# Patient Record
Sex: Male | Born: 1955 | Race: White | Hispanic: No | Marital: Married | Smoking: Current every day smoker
Health system: Southern US, Community
[De-identification: ages and names within clinical notes are randomized; demographics above are authoritative.]

## PROBLEM LIST (undated history)

## (undated) DIAGNOSIS — I639 Cerebral infarction, unspecified: Secondary | ICD-10-CM

## (undated) HISTORY — PX: KNEE SURGERY: SHX244

---

## 2003-06-24 ENCOUNTER — Inpatient Hospital Stay (HOSPITAL_COMMUNITY): Admission: EM | Admit: 2003-06-24 | Discharge: 2003-06-25 | Payer: Self-pay | Admitting: Emergency Medicine

## 2014-09-10 ENCOUNTER — Other Ambulatory Visit: Payer: Self-pay

## 2015-11-29 ENCOUNTER — Emergency Department (HOSPITAL_COMMUNITY): Payer: Self-pay

## 2015-11-29 ENCOUNTER — Inpatient Hospital Stay (HOSPITAL_COMMUNITY)
Admission: EM | Admit: 2015-11-29 | Discharge: 2015-12-03 | DRG: 481 | Disposition: A | Discharge status: Home Health | Payer: Self-pay | Attending: Internal Medicine | Admitting: Internal Medicine

## 2015-11-29 ENCOUNTER — Encounter (HOSPITAL_COMMUNITY): Payer: Self-pay

## 2015-11-29 DIAGNOSIS — T148XXA Other injury of unspecified body region, initial encounter: Secondary | ICD-10-CM

## 2015-11-29 DIAGNOSIS — S42292A Other displaced fracture of upper end of left humerus, initial encounter for closed fracture: Secondary | ICD-10-CM | POA: Insufficient documentation

## 2015-11-29 DIAGNOSIS — M79604 Pain in right leg: Secondary | ICD-10-CM

## 2015-11-29 DIAGNOSIS — Z8673 Personal history of transient ischemic attack (TIA), and cerebral infarction without residual deficits: Secondary | ICD-10-CM

## 2015-11-29 DIAGNOSIS — R410 Disorientation, unspecified: Secondary | ICD-10-CM | POA: Diagnosis not present

## 2015-11-29 DIAGNOSIS — Z79899 Other long term (current) drug therapy: Secondary | ICD-10-CM

## 2015-11-29 DIAGNOSIS — E440 Moderate protein-calorie malnutrition: Secondary | ICD-10-CM | POA: Insufficient documentation

## 2015-11-29 DIAGNOSIS — E876 Hypokalemia: Secondary | ICD-10-CM | POA: Diagnosis present

## 2015-11-29 DIAGNOSIS — I639 Cerebral infarction, unspecified: Secondary | ICD-10-CM | POA: Diagnosis present

## 2015-11-29 DIAGNOSIS — F17210 Nicotine dependence, cigarettes, uncomplicated: Secondary | ICD-10-CM | POA: Diagnosis present

## 2015-11-29 DIAGNOSIS — Z7982 Long term (current) use of aspirin: Secondary | ICD-10-CM

## 2015-11-29 DIAGNOSIS — S72141A Displaced intertrochanteric fracture of right femur, initial encounter for closed fracture: Principal | ICD-10-CM | POA: Diagnosis present

## 2015-11-29 DIAGNOSIS — S72001A Fracture of unspecified part of neck of right femur, initial encounter for closed fracture: Secondary | ICD-10-CM

## 2015-11-29 DIAGNOSIS — W1830XA Fall on same level, unspecified, initial encounter: Secondary | ICD-10-CM | POA: Diagnosis present

## 2015-11-29 DIAGNOSIS — S42255A Nondisplaced fracture of greater tuberosity of left humerus, initial encounter for closed fracture: Secondary | ICD-10-CM | POA: Diagnosis present

## 2015-11-29 DIAGNOSIS — D500 Iron deficiency anemia secondary to blood loss (chronic): Secondary | ICD-10-CM | POA: Diagnosis present

## 2015-11-29 DIAGNOSIS — Z6822 Body mass index (BMI) 22.0-22.9, adult: Secondary | ICD-10-CM

## 2015-11-29 HISTORY — DX: Cerebral infarction, unspecified: I63.9

## 2015-11-29 LAB — COMPREHENSIVE METABOLIC PANEL
ALBUMIN: 3.8 g/dL (ref 3.5–5.0)
ALK PHOS: 60 U/L (ref 38–126)
ALT: 27 U/L (ref 17–63)
AST: 31 U/L (ref 15–41)
Anion gap: 12 (ref 5–15)
BUN: 14 mg/dL (ref 6–20)
CHLORIDE: 94 mmol/L — AB (ref 101–111)
CO2: 28 mmol/L (ref 22–32)
CREATININE: 0.67 mg/dL (ref 0.61–1.24)
Calcium: 8.6 mg/dL — ABNORMAL LOW (ref 8.9–10.3)
GFR calc non Af Amer: >60 mL/min (ref >60)
GLUCOSE: 112 mg/dL — AB (ref 65–99)
Potassium: 2.9 mmol/L — ABNORMAL LOW (ref 3.5–5.1)
SODIUM: 134 mmol/L — AB (ref 135–145)
Total Bilirubin: 2 mg/dL — ABNORMAL HIGH (ref 0.3–1.2)
Total Protein: 7.7 g/dL (ref 6.5–8.1)

## 2015-11-29 LAB — CBC WITH DIFFERENTIAL/PLATELET
BASOS ABS: 0 10*3/uL (ref 0.0–0.1)
BASOS PCT: 0 %
EOS ABS: 0 10*3/uL (ref 0.0–0.7)
EOS PCT: 0 %
HCT: 35.3 % — ABNORMAL LOW (ref 39.0–52.0)
HEMOGLOBIN: 12.5 g/dL — AB (ref 13.0–17.0)
Lymphocytes Relative: 8 %
Lymphs Abs: 0.8 10*3/uL (ref 0.7–4.0)
MCH: 42.1 pg — AB (ref 26.0–34.0)
MCHC: 35.4 g/dL (ref 30.0–36.0)
MCV: 118.9 fL — ABNORMAL HIGH (ref 78.0–100.0)
Monocytes Absolute: 1.4 10*3/uL — ABNORMAL HIGH (ref 0.1–1.0)
Monocytes Relative: 13 %
NEUTROS PCT: 79 %
Neutro Abs: 8.3 10*3/uL — ABNORMAL HIGH (ref 1.7–7.7)
PLATELETS: 257 10*3/uL (ref 150–400)
RBC: 2.97 MIL/uL — AB (ref 4.22–5.81)
RDW: 14.6 % (ref 11.5–15.5)
WBC: 10.5 10*3/uL (ref 4.0–10.5)

## 2015-11-29 LAB — TYPE AND SCREEN
ABO/RH(D): O NEG
ANTIBODY SCREEN: NEGATIVE

## 2015-11-29 LAB — PROTIME-INR
INR: 0.93
Prothrombin Time: 12.4 seconds (ref 11.4–15.2)

## 2015-11-29 MED ORDER — POTASSIUM CHLORIDE CRYS ER 20 MEQ PO TBCR
40.0000 meq | EXTENDED_RELEASE_TABLET | Freq: Once | ORAL | Status: AC
  Administered 2015-11-29: 40 meq via ORAL
  Filled 2015-11-29: qty 2

## 2015-11-29 MED ORDER — ASPIRIN 325 MG PO TABS
325.0000 mg | ORAL_TABLET | Freq: Every day | ORAL | Status: DC
  Administered 2015-11-29 – 2015-12-03 (×5): 325 mg via ORAL
  Filled 2015-11-29 (×5): qty 1

## 2015-11-29 MED ORDER — SODIUM CHLORIDE 0.9 % IV SOLN: INTRAVENOUS | Status: DC

## 2015-11-29 MED ORDER — HYDROCODONE-ACETAMINOPHEN 5-325 MG PO TABS
1.0000 | ORAL_TABLET | Freq: Four times a day (QID) | ORAL | Status: DC | PRN
  Administered 2015-11-29 – 2015-12-01 (×5): 2 via ORAL
  Filled 2015-11-29 (×5): qty 2

## 2015-11-29 MED ORDER — POTASSIUM CHLORIDE IN NACL 20-0.9 MEQ/L-% IV SOLN
INTRAVENOUS | Status: DC
  Administered 2015-11-29: 17:00:00 via INTRAVENOUS

## 2015-11-29 MED ORDER — MORPHINE SULFATE (PF) 4 MG/ML IV SOLN
6.0000 mg | Freq: Once | INTRAVENOUS | Status: AC
  Administered 2015-11-29: 6 mg via INTRAVENOUS
  Filled 2015-11-29: qty 2

## 2015-11-29 MED ORDER — MORPHINE SULFATE (PF) 2 MG/ML IV SOLN
0.5000 mg | INTRAVENOUS | Status: DC | PRN
  Administered 2015-11-30 (×2): 0.5 mg via INTRAVENOUS
  Filled 2015-11-29 (×2): qty 1

## 2015-11-29 MED ORDER — HEPARIN SODIUM (PORCINE) 5000 UNIT/ML IJ SOLN
5000.0000 [IU] | Freq: Three times a day (TID) | INTRAMUSCULAR | Status: DC
  Administered 2015-11-29 – 2015-11-30 (×5): 5000 [IU] via SUBCUTANEOUS
  Filled 2015-11-29 (×6): qty 1

## 2015-11-29 MED ORDER — MORPHINE SULFATE (PF) 4 MG/ML IV SOLN
4.0000 mg | INTRAVENOUS | Status: DC | PRN
  Administered 2015-11-29: 4 mg via INTRAVENOUS
  Filled 2015-11-29: qty 1

## 2015-11-29 NOTE — ED Provider Notes (Signed)
[facility]-EMERGENCY DEPT Provider Note   CSN: [id number] Arrival date & time: 11/29/15  1050  By signing my name below, I, Placido Sou, attest that this documentation has been prepared under the direction and in the presence of Azalia Bilis, MD. Electronically Signed: Placido Sou, ED Scribe. 11/29/15. 11:49 AM.   History   Chief Complaint Chief Complaint  Patient presents with  . Fall    HPI HPI Comments: Chris Robinson is a 60 y.o. male who presents to the Emergency Department by ambulance complaining of a fall that occurred two days ago. He states his wife fell towards him causing him to fall to the ground and land on his right side. He reports associated, moderate, right hip and right anterior thigh pain. He denies having ambulated since the fall and reports worsening pain with any movement at all. He also complains of left shoulder pain for the past few months s/p another fall but denies it has acutely worsened since his fall two days ago. Pt denies a h/o hip fracture. He reports a h/o of stroke and experiences generalized weakness with right greater than left. He is a smoker. Pt does not regularly consume ETOH. He denies abdominal pain and CP.   The history is provided by the patient. No language interpreter was used.   Past Medical History:  Diagnosis Date  . Stroke [facility])     There are no active problems to display for this patient.   Past Surgical History:  Procedure Laterality Date  . KNEE SURGERY       Home Medications    Prior to Admission medications   Medication Sig Start Date End Date Taking? Authorizing Provider  acetaminophen (TYLENOL) 500 MG tablet Take 1,000 mg by mouth every 6 (six) hours as needed.   Yes Historical Provider, MD  aspirin 325 MG tablet Take 325 mg by mouth daily.   Yes Historical Provider, MD    Family History No family history on file.  Social History Social History  Substance Use Topics  . Smoking status: Current Every Day Smoker   . Smokeless tobacco: Never Used  . Alcohol use Yes     Comment: occ     Allergies   Review of patient's allergies indicates no known allergies.   Review of Systems Review of Systems  Cardiovascular: Negative for chest pain.  Gastrointestinal: Negative for abdominal pain.  Musculoskeletal: Positive for arthralgias and myalgias.  Skin: Negative for wound.  Neurological: Positive for weakness.  All other systems reviewed and are negative.  Physical Exam Updated Vital Signs BP 149/99 (BP Location: Left Arm)   Pulse 107   Temp 99.1 F (37.3 C) (Oral)   Resp 16   Ht 6' (1.829 m)   Wt 175 lb (79.4 kg)   SpO2 97%   BMI 23.73 kg/m   Physical Exam  Constitutional: He is oriented to person, place, and time. He appears well-developed and well-nourished.  HENT:  Head: Normocephalic and atraumatic.  Eyes: EOM are normal.  Neck: Normal range of motion.  Cardiovascular: Normal rate, regular rhythm, normal heart sounds and intact distal pulses.   Pulmonary/Chest: Effort normal and breath sounds normal. No respiratory distress.  Abdominal: Soft. He exhibits no distension. There is no tenderness.  Musculoskeletal: Normal range of motion. He exhibits tenderness.  Shortening and external rotation of RLE. Nml DP/PT pulse. Pain with ROM of the left shoulder w/o obvious deformity.   Neurological: He is alert and oriented to person, place, and time.  Skin:  Skin is warm and dry.  Psychiatric: He has a normal mood and affect. Judgment normal.  Nursing note and vitals reviewed.  ED Treatments / Results  Labs (all labs ordered are listed, but only abnormal results are displayed) Labs Reviewed  CBC WITH DIFFERENTIAL/PLATELET - Abnormal; Notable for the following:       Result Value   RBC 2.97 (*)    Hemoglobin 12.5 (*)    HCT 35.3 (*)    MCV 118.9 (*)    MCH 42.1 (*)    Neutro Abs 8.3 (*)    Monocytes Absolute 1.4 (*)    All other components within normal limits  PROTIME-INR    COMPREHENSIVE METABOLIC PANEL  TYPE AND SCREEN    EKG  EKG Interpretation  Date/Time:  Tuesday November 29 2015 12:08:13 EDT Ventricular Rate:  98 PR Interval:    QRS Duration: 101 QT Interval:  359 QTC Calculation: 459 R Axis:   25 Text Interpretation:  Sinus tachycardia Atrial premature complexes Borderline short PR interval Low voltage, extremity leads Borderline ST depression, diffuse leads No significant change was found Confirmed by Verline Kong  MD, Caryn Bee ([id number]) on 11/29/2015 12:48:48 PM       Radiology Dg Chest 1 View  Result Date: 11/29/2015 CLINICAL DATA:  60 year old male status post falls. Hip fracture. Preoperative study. Initial encounter. EXAM: CHEST 1 VIEW COMPARISON:  Chest radiographs 06/24/2003 FINDINGS: AP supine view at 1216 hours.Cardiac size at the upper limits of normal to mildly enlarged. Other mediastinal contours are within normal limits. Visualized tracheal air column is within normal limits. Allowing for technique the lungs are clear. No pneumothorax or effusion. IMPRESSION: No acute cardiopulmonary abnormality. Borderline to mild cardiomegaly. Electronically Signed   By: Odessa Fleming M.D.   On: 11/29/2015 12:50   Dg Shoulder Left  Result Date: 11/29/2015 CLINICAL DATA:  Acute left shoulder pain following fall. Initial encounter. EXAM: LEFT SHOULDER - 2+ VIEW COMPARISON:  None. FINDINGS: A mildly comminuted fracture of the lateral posterior humeral head/ greater tuberosity identified. There is no evidence of subluxation or dislocation. No suspicious focal bony lesions are noted. No other fractures identified. IMPRESSION: Mildly comminuted lateral posterior humeral head/ greater tuberosity fracture. Electronically Signed   By: Harmon Pier M.D.   On: 11/29/2015 12:52   Dg Hip Unilat W Or Wo Pelvis 2-3 Views Right  Result Date: 11/29/2015 CLINICAL DATA:  Acute right hip pain following fall 2 days ago. Initial encounter. EXAM: DG HIP (WITH OR WITHOUT PELVIS) 2-3V  RIGHT COMPARISON:  None. FINDINGS: A mildly comminuted intertrochanteric fracture the proximal right femur noted without significant angulation identified. There is no evidence of subluxation or dislocation. Mild degenerative changes in the hips are present bilaterally. No suspicious focal bony lesions are noted. IMPRESSION: Mildly comminuted right intertrochanteric femur fracture. Electronically Signed   By: Harmon Pier M.D.   On: 11/29/2015 12:50    Procedures Procedures  DIAGNOSTIC STUDIES: Oxygen Saturation is 99% on RA, normal by my interpretation.    COORDINATION OF CARE: 11:47 AM Discussed next steps with pt. Pt verbalized understanding and is agreeable with the plan.    Medications Ordered in ED Medications  morphine 4 MG/ML injection 6 mg (6 mg Intravenous Given 11/29/15 1159)     Initial Impression / Assessment and Plan / ED Course  I have reviewed the triage vital signs and the nursing notes.  Pertinent labs & imaging results that were available during my care of the patient were reviewed by me and  considered in my medical decision making (see chart for details).  Clinical Course    Right intertrochanteric hip fracture.  Left shoulder fracture of the humeral head.  Voice message left for Dr. Romeo AppleHarrison, orthopedic surgery.  Patient be admitted to the hospital.  Nothing by mouth now.   I personally performed the services described in this documentation, which was scribed in my presence. The recorded information has been reviewed and is accurate.      Final Clinical Impressions(s) / ED Diagnoses   Final diagnoses:  Closed displaced intertrochanteric fracture of right femur, initial encounter (HCC)  Humeral head fracture, left, closed, initial encounter    New Prescriptions New Prescriptions   No medications on file     Azalia BilisKevin Pragya Lofaso, MD 11/29/15 1318

## 2015-11-29 NOTE — H&P (Signed)
History and Physical    Chris Robinson BJY:[id number] DOB: 18-Dec-1955 DOA: 11/29/2015  PCP: No PCP Per Patient  Patient coming from: Home  Chief Complaint:  Right hip pain  HPI: Chris Robinson is a 60 y.o. male with medical history significant of CVA and cigarettes smoking. Patient came to the hospital because of right hip pain. Patient reported that he was trying to help his wife who fell on Sunday and because of his balance issues secondary to his previous stroke he fell himself and landed on his right hip he developed severe pain but he was able to crawl back to his bed. He decided to come today to seek medical help, because he couldn't walk on it. X-ray showed mildly comminuted intertrochanteric right hip fracture.  ED Course:  Vitals: WNL Labs: WNL except for hypokalemia Imaging: Normal CXR Interventions: Morphine for pain, orthopedics consulted.  Review of Systems:  Constitutional: negative for anorexia, fevers and sweats. Eyes: negative for irritation, redness and visual disturbance Ears, nose, mouth, throat, and face: negative for earaches, epistaxis, nasal congestion and sore throat Respiratory: negative for cough, dyspnea on exertion, sputum and wheezing Cardiovascular: negative for chest pain, dyspnea, lower extremity edema, orthopnea, palpitations and syncope Gastrointestinal: negative for abdominal pain, constipation, diarrhea, melena, nausea and vomiting Genitourinary:negative for dysuria, frequency and hematuria Hematologic/lymphatic: negative for bleeding, easy bruising and lymphadenopathy Musculoskeletal: Right hip pain Neurological: negative for coordination problems, gait problems, headaches and weakness Endocrine: negative for diabetic symptoms including polydipsia, polyuria and weight loss Allergic/Immunologic: negative for anaphylaxis, hay fever and urticaria  Past Medical History:  Diagnosis Date  . Stroke [facility])     Past Surgical History:  Procedure  Laterality Date  . KNEE SURGERY       reports that he has been smoking.  He has never used smokeless tobacco. He reports that he drinks alcohol. He reports that he does not use drugs.  No Known Allergies  Family history -Wife has history of brain cancer  Prior to Admission medications   Medication Sig Start Date End Date Taking? Authorizing Provider  acetaminophen (TYLENOL) 500 MG tablet Take 1,000 mg by mouth every 6 (six) hours as needed.   Yes Historical Provider, MD  aspirin 325 MG tablet Take 325 mg by mouth daily.   Yes Historical Provider, MD    Physical Exam:  Vitals:   11/29/15 1056 11/29/15 1134 11/29/15 1253 11/29/15 1354  BP: 146/96 132/87 149/99 143/85  Pulse: 99 100 107 99  Resp: 20 18 16 16   Temp: 99.1 F (37.3 C)     TempSrc: Oral     SpO2: 99% 98% 97% 96%  Weight:      Height:        Constitutional: NAD, calm, comfortable Eyes: PERRL, lids and conjunctivae normal ENMT: Mucous membranes are moist. Posterior pharynx clear of any exudate or lesions.Normal dentition.  Neck: normal, supple, no masses, no thyromegaly Respiratory: clear to auscultation bilaterally, no wheezing, no crackles. Normal respiratory effort. No accessory muscle use.  Cardiovascular: Regular rate and rhythm, no murmurs / rubs / gallops. No extremity edema. 2+ pedal pulses. No carotid bruits.  Abdomen: no tenderness, no masses palpated. No hepatosplenomegaly. Bowel sounds positive.  Musculoskeletal: no clubbing / cyanosis. No joint deformity upper and lower extremities. Good ROM, no contractures. Normal muscle tone.  Skin: no rashes, lesions, ulcers. No induration Neurologic: CN 2-12 grossly intact. Sensation intact, DTR normal. Strength 5/5 in all 4.  Psychiatric: Normal judgment and insight. Alert and oriented  x 3. Normal mood.   Labs on Admission: I have personally reviewed following labs and imaging studies  CBC:  Recent Labs Lab 11/29/15 1152  WBC 10.5  NEUTROABS 8.3*  HGB  12.5*  HCT 35.3*  MCV 118.9*  PLT 257   Basic Metabolic Panel:  Recent Labs Lab 11/29/15 1152  NA 134*  K 2.9*  CL 94*  CO2 28  GLUCOSE 112*  BUN 14  CREATININE 0.67  CALCIUM 8.6*   GFR: Estimated Creatinine Clearance: 107.8 mL/min (by C-G formula based on SCr of 0.67 mg/dL). Liver Function Tests:  Recent Labs Lab 11/29/15 1152  AST 31  ALT 27  ALKPHOS 60  BILITOT 2.0*  PROT 7.7  ALBUMIN 3.8   No results for input(s): LIPASE, AMYLASE in the last 168 hours. No results for input(s): AMMONIA in the last 168 hours. Coagulation Profile:  Recent Labs Lab 11/29/15 1152  INR 0.93   Cardiac Enzymes: No results for input(s): CKTOTAL, CKMB, CKMBINDEX, TROPONINI in the last 168 hours. BNP (last 3 results) No results for input(s): PROBNP in the last 8760 hours. HbA1C: No results for input(s): HGBA1C in the last 72 hours. CBG: No results for input(s): GLUCAP in the last 168 hours. Lipid Profile: No results for input(s): CHOL, HDL, LDLCALC, TRIG, CHOLHDL, LDLDIRECT in the last 72 hours. Thyroid Function Tests: No results for input(s): TSH, T4TOTAL, FREET4, T3FREE, THYROIDAB in the last 72 hours. Anemia Panel: No results for input(s): VITAMINB12, FOLATE, FERRITIN, TIBC, IRON, RETICCTPCT in the last 72 hours. Urine analysis: No results found for: COLORURINE, APPEARANCEUR, LABSPEC, PHURINE, GLUCOSEU, HGBUR, BILIRUBINUR, KETONESUR, PROTEINUR, UROBILINOGEN, NITRITE, LEUKOCYTESUR Sepsis Labs: !!!!!!!!!!!!!!!!!!!!!!!!!!!!!!!!!!!!!!!!!!!! Invalid input(s): PROCALCITONIN, LACTICIDVEN No results found for this or any previous visit (from the past 240 hour(s)).   Radiological Exams on Admission: Dg Chest 1 View  Result Date: 11/29/2015 CLINICAL DATA:  60 year old male status post falls. Hip fracture. Preoperative study. Initial encounter. EXAM: CHEST 1 VIEW COMPARISON:  Chest radiographs 06/24/2003 FINDINGS: AP supine view at 1216 hours.Cardiac size at the upper limits of  normal to mildly enlarged. Other mediastinal contours are within normal limits. Visualized tracheal air column is within normal limits. Allowing for technique the lungs are clear. No pneumothorax or effusion. IMPRESSION: No acute cardiopulmonary abnormality. Borderline to mild cardiomegaly. Electronically Signed   By: Odessa FlemingH  Hall M.D.   On: 11/29/2015 12:50   Dg Shoulder Left  Result Date: 11/29/2015 CLINICAL DATA:  Acute left shoulder pain following fall. Initial encounter. EXAM: LEFT SHOULDER - 2+ VIEW COMPARISON:  None. FINDINGS: A mildly comminuted fracture of the lateral posterior humeral head/ greater tuberosity identified. There is no evidence of subluxation or dislocation. No suspicious focal bony lesions are noted. No other fractures identified. IMPRESSION: Mildly comminuted lateral posterior humeral head/ greater tuberosity fracture. Electronically Signed   By: Harmon PierJeffrey  Hu M.D.   On: 11/29/2015 12:52   Dg Hip Unilat W Or Wo Pelvis 2-3 Views Right  Result Date: 11/29/2015 CLINICAL DATA:  Acute right hip pain following fall 2 days ago. Initial encounter. EXAM: DG HIP (WITH OR WITHOUT PELVIS) 2-3V RIGHT COMPARISON:  None. FINDINGS: A mildly comminuted intertrochanteric fracture the proximal right femur noted without significant angulation identified. There is no evidence of subluxation or dislocation. Mild degenerative changes in the hips are present bilaterally. No suspicious focal bony lesions are noted. IMPRESSION: Mildly comminuted right intertrochanteric femur fracture. Electronically Signed   By: Harmon PierJeffrey  Hu M.D.   On: 11/29/2015 12:50    EKG: Independently reviewed.  Assessment/Plan Principal Problem:   Closed right hip fracture, initial encounter (HCC) Active Problems:   Hypokalemia   CVA (cerebral vascular accident) (HCC)    Right hip fracture -Mildly comminuted right intertrochanteric hip fracture presented after a fall. -Orthopedics, Dr. Romeo Apple consulted. -DVT prophylaxis:  Subcutaneous heparin -Pain control : Opioids -Cardiac risk stratification: Patient is low risk for surgery.  Hypokalemia -Potassium of 2.9, will replete with oral and IV supplements, check BMP in a.m.  History of CVA -Patient is on aspirin, continue.  Fall -Mechanical fall, no further workup.   DVT prophylaxis: SQ Heparin Code Status:  Full code Family Communication: Plan D/W patient Disposition Plan: Home Consults called:  Admission status: Inpatient   [facility] A MD [facility] Pager [phone]  If 7PM-7AM, please contact night-coverage [email or web address] Password [email or web address]  11/29/2015, 2:00 PM

## 2015-11-29 NOTE — ED Triage Notes (Signed)
Ems reports pt fell Sunday and c/o pain in r hip and left shoulder.  Reports has been nonambulatory since the fall.  Reports rotation noted to r leg.

## 2015-11-29 NOTE — ED Notes (Signed)
Pt offered sling, states he is comfortable as is and does not want at this time.

## 2015-11-30 ENCOUNTER — Inpatient Hospital Stay (HOSPITAL_COMMUNITY): Payer: Self-pay

## 2015-11-30 DIAGNOSIS — I63019 Cerebral infarction due to thrombosis of unspecified vertebral artery: Secondary | ICD-10-CM

## 2015-11-30 DIAGNOSIS — S42292A Other displaced fracture of upper end of left humerus, initial encounter for closed fracture: Secondary | ICD-10-CM

## 2015-11-30 DIAGNOSIS — S72141A Displaced intertrochanteric fracture of right femur, initial encounter for closed fracture: Principal | ICD-10-CM

## 2015-11-30 DIAGNOSIS — I509 Heart failure, unspecified: Secondary | ICD-10-CM

## 2015-11-30 LAB — ECHOCARDIOGRAM COMPLETE
CHL CUP MV DEC (S): 257
CHL CUP RV SYS PRESS: 43 mmHg
CHL CUP STROKE VOLUME: 38 mL
E/e' ratio: 8.03
EWDT: 257 ms
FS: 35 % (ref 28–44)
Height: 72 in
IV/PV OW: 1
LA diam end sys: 35 mm
LA vol: 54.5 mL
LADIAMINDEX: 1.78 cm/m2
LASIZE: 35 mm
LAVOLA4C: 48.4 mL
LAVOLIN: 27.8 mL/m2
LV E/e' medial: 8.03
LV PW d: 10.2 mm — AB (ref 0.6–1.1)
LV SIMPSON'S DISK: 65
LV TDI E'LATERAL: 8.54
LV TDI E'MEDIAL: 8.16
LV dias vol index: 30 mL/m2
LV dias vol: 58 mL — AB (ref 62–150)
LV e' LATERAL: 8.54 cm/s
LV sys vol: 21 mL (ref 21–61)
LVEEAVG: 8.03
LVOT VTI: 15.3 cm
LVOT area: 3.46 cm2
LVOT diameter: 21 mm
LVOT peak grad rest: 3 mmHg
LVOTPV: 84.1 cm/s
LVOTSV: 53 mL
LVSYSVOLIN: 10 mL/m2
Lateral S' vel: 15.1 cm/s
MVPKAVEL: 60.9 m/s
MVPKEVEL: 68.6 m/s
Reg peak vel: 315 cm/s
TAPSE: 22.3 mm
TRMAXVEL: 315 cm/s
WEIGHTICAEL: 2677.27 [oz_av]

## 2015-11-30 LAB — BASIC METABOLIC PANEL
ANION GAP: 9 (ref 5–15)
BUN: 14 mg/dL (ref 6–20)
CALCIUM: 8 mg/dL — AB (ref 8.9–10.3)
CO2: 28 mmol/L (ref 22–32)
Chloride: 97 mmol/L — ABNORMAL LOW (ref 101–111)
Creatinine, Ser: 0.59 mg/dL — ABNORMAL LOW (ref 0.61–1.24)
GLUCOSE: 115 mg/dL — AB (ref 65–99)
POTASSIUM: 3.2 mmol/L — AB (ref 3.5–5.1)
SODIUM: 134 mmol/L — AB (ref 135–145)

## 2015-11-30 LAB — CBC
HCT: 32.1 % — ABNORMAL LOW (ref 39.0–52.0)
Hemoglobin: 11.1 g/dL — ABNORMAL LOW (ref 13.0–17.0)
MCH: 41.4 pg — ABNORMAL HIGH (ref 26.0–34.0)
MCHC: 34.6 g/dL (ref 30.0–36.0)
MCV: 119.8 fL — AB (ref 78.0–100.0)
PLATELETS: 233 10*3/uL (ref 150–400)
RBC: 2.68 MIL/uL — AB (ref 4.22–5.81)
RDW: 13.1 % (ref 11.5–15.5)
WBC: 8.3 10*3/uL (ref 4.0–10.5)

## 2015-11-30 LAB — SURGICAL PCR SCREEN
MRSA, PCR: NEGATIVE
Staphylococcus aureus: NEGATIVE

## 2015-11-30 LAB — MAGNESIUM: MAGNESIUM: 1.4 mg/dL — AB (ref 1.7–2.4)

## 2015-11-30 MED ORDER — SODIUM CHLORIDE 0.9 % IV SOLN
INTRAVENOUS | Status: AC
  Administered 2015-11-30: 22:00:00 via INTRAVENOUS

## 2015-11-30 MED ORDER — POTASSIUM CHLORIDE CRYS ER 20 MEQ PO TBCR
40.0000 meq | EXTENDED_RELEASE_TABLET | Freq: Two times a day (BID) | ORAL | Status: DC
  Administered 2015-11-30 (×2): 40 meq via ORAL
  Filled 2015-11-30 (×2): qty 2

## 2015-11-30 MED ORDER — ENSURE ENLIVE PO LIQD
237.0000 mL | ORAL | Status: DC
  Administered 2015-12-01 – 2015-12-02 (×2): 237 mL via ORAL

## 2015-11-30 MED ORDER — MAGNESIUM SULFATE 2 GM/50ML IV SOLN
2.0000 g | Freq: Once | INTRAVENOUS | Status: AC
  Administered 2015-11-30: 2 g via INTRAVENOUS
  Filled 2015-11-30: qty 50

## 2015-11-30 NOTE — Care Management Note (Signed)
Case Management Note  Patient Details  Name: Tyler DeisCraig S Ridling MRN: [id number] Date of Birth: [birth year]  Subjective/Objective:                  Pt is from home, lives with his wife. 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 He has no insurance. He is ind with ADL's at baseline. Repair of his femur rx is scheduled for tomorrow. He plans to return home at DC. He anticipates having some visits with HH. Pt states he has cane, walker, shower chair at home to use if needed. He does not have WC.   Action/Plan: Will cont to follow and determine pt needs at DC.   Expected Discharge Date:    12/03/2015              Expected Discharge Plan:  Home w Home Health Services  In-House Referral:  NA  Discharge planning Services  CM Consult  Post Acute Care Choice:  Home Health Choice offered to:  Patient  Status of Service:  In process, will continue to follow  Malcolm MetroChildress, Jailine Lieder Demske, RN 11/30/2015, 2:15 PM

## 2015-11-30 NOTE — Progress Notes (Signed)
Initial Nutrition Assessment  DOCUMENTATION CODES:   Non-severe (moderate) malnutrition in context of acute illness/injury  INTERVENTION:   - Provide Ensure Enlive oral nutrition supplement Q24. Will increase frequency after surgery if pt agreeable. Each provides 350 kcal and 20 grams protein. - Encourage PO intake. - Will continue to monitor for nutrition-related needs.  NUTRITION DIAGNOSIS:   Malnutrition related to acute illness as evidenced by mild depletion of body fat, moderate depletions of muscle mass.  GOAL:   Patient will meet greater than or equal to 90% of their needs  MONITOR:   PO intake, Supplement acceptance  REASON FOR ASSESSMENT:   Consult Hip fracture protocol  ASSESSMENT:   60 y.o. male with medical history significant of CVA and cigarettes smoking. Patient came to the hospital because of right hip pain. Patient reported that he was trying to help his wife who fell on Sunday and because of his balance issues secondary to his previous stroke he fell himself and landed on his right hip he developed severe pain but he was able to crawl back to his bed. He decided to come today to seek medical help, because he couldn't walk on it. X-ray showed mildly comminuted intertrochanteric right hip fracture.  Noted surgery scheduled for 12/01/15.  Spoke with pt at bedside who reports poor appetite 2-3 days PTA. Prior to episode of poor appetite, pt reports fairly good appetite and eating 2 meals per day. For lunch, pt reports typically consume homemade soup or a sandwich. Pt reports a typical dinner consists of 3 items. Per chart, pt consumed 50% of past two meals.  Pt reports UBW is 175#. Pt unsure when he last weighed this but says his weight is "stable." Wt hx unavailable in chart.  Pt interested in receiving Ensure Enlive oral nutrition supplement. Will order Q24 and increase after surgery if pt agreeable.  Pt denies any issues chewing or swallowing. States he does  most of the grocery shopping and cooking at home. Pt reports consuming a MVI PTA.  Medications reviewed and include 5,000 units IV heparin every 8 hrs, 2 g IV magnesium sulfate once, 40 mEq potassium chloride BID  Labs reviewed and include low sodium (134 mmol/L), low potassium (3.2 mmol/L), low chloride (97 mmol/L), low creatinine (0.59 mg/dL), low calcium (8.0 mg/dL), elevated glucose (440115 mg/dL), low magnesium (1.4 mg/dL)  NFPE: Exam completed. Mild fat depletion, mild/moderate muscle depletion, and no edema noted.  Diet Order:  Diet NPO time specified Except for: Sips with Meds Diet Heart Room service appropriate? Yes; Fluid consistency: Thin  Skin:  Reviewed, no issues  Last BM:  11/26/15  Height:   Ht Readings from Last 1 Encounters:  11/29/15 6' (1.829 m)    Weight:   Wt Readings from Last 1 Encounters:  11/29/15 167 lb 5.3 oz (75.9 kg)    Ideal Body Weight:  80.9 kg  BMI:  Body mass index is 22.69 kg/m.  Estimated Nutritional Needs:   Kcal:  1900-2100 (25-28 kcal/kg)  Protein:  91-106 grams (1.2-1.4 g/kg)  Fluid:  1.9-2.0 L/day  EDUCATION NEEDS:   No education needs identified at this time  Rosemarie AxKate Cayman [place] Dietetic Intern Pager Number: [phone]

## 2015-11-30 NOTE — Consult Note (Signed)
Reason for Consult: right hip fracture  Referring Physician: DR Burney Gauze  Chris Robinson is an 60 y.o. male.  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  C/o severe right hip pain non radiating constant worse with movement   The patient fell 2 weeks ago injured his left shoulder did not seek medical attention. During a workup of his hip fracture he was found to have a nondisplaced greater tuberosity fracture the left shoulder. He complains of constant pain there as well although has regained almost full range of motion.  Past Medical History:  Diagnosis Date  . Stroke [facility])     Past Surgical History:  Procedure Laterality Date  . KNEE SURGERY      Family history denies anesthetic problems, diabetes hypertension.  Social History:  reports that he has been smoking.  He has never used smokeless tobacco. He reports that he drinks alcohol. He reports that he does not use drugs.  Allergies: No Known Allergies   Current Facility-Administered Medications:  .  0.9 % NaCl with KCl 20 mEq/ L  infusion, , Intravenous, Continuous, Clydia Llano, MD, Last Rate: 75 mL/hr at 11/29/15 1717 .  aspirin tablet 325 mg, 325 mg, Oral, Daily, Clydia Llano, MD, 325 mg at 11/29/15 1707 .  heparin injection 5,000 Units, 5,000 Units, Subcutaneous, Q8H, Clydia Llano, MD, 5,000 Units at 11/30/15 0518 .  HYDROcodone-acetaminophen (NORCO/VICODIN) 5-325 MG per tablet 1-2 tablet, 1-2 tablet, Oral, Q6H PRN, Clydia Llano, MD, 2 tablet at 11/30/15 0518 .  morphine 2 MG/ML injection 0.5 mg, 0.5 mg, Intravenous, Q2H PRN, Clydia Llano, MD .  potassium chloride SA (K-DUR,KLOR-CON) CR tablet 40 mEq, 40 mEq, Oral, BID, Vickki Hearing, MD   Dg Chest 1 View  Result Date: 11/29/2015 CLINICAL DATA:  60 year old male status post falls. Hip fracture. Preoperative study. Initial encounter. EXAM: CHEST 1 VIEW COMPARISON:  Chest radiographs 06/24/2003 FINDINGS: AP supine view at 1216 hours.Cardiac size at the upper limits of normal to mildly enlarged. Other mediastinal contours are within normal limits. Visualized tracheal air column is within normal limits. Allowing for technique the lungs are clear. No pneumothorax or effusion. IMPRESSION: No acute cardiopulmonary abnormality. Borderline to mild cardiomegaly. Electronically Signed   By: Odessa Fleming M.D.   On: 11/29/2015 12:50   Dg Shoulder Left  Result Date: 11/29/2015 CLINICAL DATA:  Acute left shoulder pain following fall. Initial encounter. EXAM: LEFT SHOULDER - 2+ VIEW COMPARISON:  None. FINDINGS: A mildly comminuted fracture of the lateral posterior humeral head/ greater tuberosity identified. There is no evidence of subluxation or dislocation. No suspicious focal bony lesions are noted. No other fractures identified. IMPRESSION: Mildly comminuted lateral posterior humeral head/ greater tuberosity fracture. Electronically Signed   By: Harmon Pier M.D.   On: 11/29/2015 12:52   Dg Hip Unilat W Or Wo Pelvis 2-3 Views Right  Result Date: 11/29/2015 CLINICAL DATA:  Acute right hip pain following fall 2 days ago. Initial encounter. EXAM: DG HIP (WITH OR WITHOUT PELVIS) 2-3V RIGHT COMPARISON:  None. FINDINGS: A mildly comminuted intertrochanteric fracture the proximal right femur noted without significant angulation identified. There is no evidence of subluxation or dislocation. Mild degenerative changes in the hips are present  bilaterally. No suspicious focal bony lesions are noted. IMPRESSION: Mildly comminuted right intertrochanteric femur fracture. Electronically Signed   By: Harmon Pier M.D.   On: 11/29/2015 12:50    ROS Blood pressure 132/67, pulse 97, temperature 98 F  (36.7 C), temperature source Oral, resp. rate 18, height 6' (1.829 m), weight 167 lb 5.3 oz (75.9 kg), SpO2 95 %. Physical Exam  Constitutional: He is oriented to person, place, and time. He appears well-developed and well-nourished. No distress.  HENT:  Head: Normocephalic and atraumatic.  Right Ear: External ear normal.  Left Ear: External ear normal.  Eyes: Conjunctivae and EOM are normal. Pupils are equal, round, and reactive to light. Right eye exhibits no discharge. Left eye exhibits no discharge. No scleral icterus.  Neck: Normal range of motion. Neck supple. No JVD present. No tracheal deviation present. No thyromegaly present.  Cardiovascular: Normal rate, regular rhythm and intact distal pulses.   Respiratory: Effort normal and breath sounds normal. No stridor. No respiratory distress. He has no wheezes. He exhibits no tenderness.  GI: Soft. Bowel sounds are normal. He exhibits no distension and no mass. There is no tenderness.  Lymphadenopathy:    He has no cervical adenopathy.  Neurological: He is alert and oriented to person, place, and time. He displays normal reflexes. No cranial nerve deficit. He exhibits normal muscle tone. Coordination normal.  There are no pathologic reflexes in the lower extremities.  Skin: Skin is warm and dry. He is not diaphoretic.  Both lower extremities have increased pigmentation starting at the mid tibial level extending through the end of the foot.  Although pulses are normal and there is minimal edema this is suggestive of venous stasis disease.  In his right and left upper extremity and the skin is normal with no lesions rashes ulceration.  Psychiatric: He has a normal mood and affect. His behavior is normal. Judgment and thought content normal.   Right upper extremity: Normal alignment, full range of motion, normal strength, shoulder, elbow, wrist stability normal skin normal.  Right lower extremity: The right lower extremity is held in  external rotation is tender in the proximal femur hip area with minimal swelling. Hip and ankle are reduced and stable. Muscle tone normal. Range of motion testing deferred for fracture and pain  Left upper extremity: Tenderness over the left proximal humerus. Normal alignment. Active flexion 150 internal rotation level L4. Shoulder remain stable anteriorly posteriorly and inferiorly. Mild weakness in flexion and abduction normal strength in internal and external rotation  Left lower extremity: Normal alignment, full range of motion, normal strength, shoulder, elbow, wrist stability normal skin normal.   Assessment/Plan: CBC Latest Ref Rng & Units 11/30/2015 11/29/2015  WBC 4.0 - 10.5 K/uL 8.3 10.5  Hemoglobin 13.0 - 17.0 g/dL 11.1(L) 12.5(L)  Hematocrit 39.0 - 52.0 % 32.1(L) 35.3(L)  Platelets 150 - 400 K/uL 233 257   BMP Latest Ref Rng & Units 11/30/2015 11/29/2015  Glucose 65 - 99 mg/dL 960(A) 540(J)  BUN 6 - 20 mg/dL 14 14  Creatinine 8.11 - 1.24 mg/dL 9.14(N) 8.29  Sodium 562 - 145 mmol/L 134(L) 134(L)  Potassium 3.5 - 5.1 mmol/L 3.2(L) 2.9(L)  Chloride 101 - 111 mmol/L 97(L) 94(L)  CO2 22 - 32 mmol/L 28 28  Calcium 8.9 - 10.3 mg/dL 8.0(L) 8.6(L)   Right peritrochanteric hip fracture at the base of the neck and inotropic region with external rotation minimal displacement mild comminution  Recommend internal fixation with intramedullary implant  Correct hypokalemia.  This procedure has been fully reviewed with the patient and written informed consent has been obtained.   Right hip open treatment internal fixation with gamma nail on October 19  Fuller CanadaStanley Harrison 11/30/2015, 7:46 AM

## 2015-11-30 NOTE — Clinical Social Work Note (Signed)
Clinical Social Work Assessment  Patient Details  Name: Chris Robinson MRN: [id number] Date of Birth: [birth year]  Date of referral:  11/30/15               Reason for consult:  Discharge Planning                Permission sought to share information with:    Permission granted to share information::     Name::        Agency::     Relationship::     Contact Information:     Housing/Transportation Living arrangements for the past 2 months:  Single Family Home Source of Information:  Patient Patient Interpreter Needed:  None Criminal Activity/Legal Involvement Pertinent to Current Situation/Hospitalization:  No - Comment as needed Significant Relationships:  Spouse Lives with:  Spouse Do you feel safe going back to the place where you live?  Yes Need for family participation in patient care:  No (Coment)  Care giving concerns:  Pt admitted due to hip fracture.   Social Worker assessment / plan:  CSW met with pt at bedside. Pt alert and oriented and reports that he lives with his wife. 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 Pt states she is fine to be alone and if needed, he can have a substitute aid sent out to the home. Pt was assisting her yesterday when he fell, fracturing his hip. Surgery planned for tomorrow. Pt states he had a stroke in December last year and has some weakness and balance problems. CSW discussed d/c planning and pt states he intends to return home. He does not have insurance. CSW agreed to follow up after PT evaluation.   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   Patient/Family's Understanding of and Emotional Response to Diagnosis, Current Treatment, and  Prognosis:  Pt is aware of surgery scheduled for tomorrow and is not concerned at all about surgery or recovery. He states his wife had the same thing and did well. CSW agreed to follow up after surgery.   Emotional Assessment Appearance:  Appears stated age Attitude/Demeanor/Rapport:  Other (Coopeartive) Affect (typically observed):  Calm Orientation:  Oriented to Self, Oriented to Place, Oriented to  Time, Oriented to Situation Alcohol / Substance use:  Not Applicable Psych involvement (Current and /or in the community):  No (Comment)  Discharge Needs  Concerns to be addressed:  Discharge Planning Concerns Readmission within the last 30 days:  No Current discharge risk:  Physical Impairment Barriers to Discharge:  Continued Medical Work up   [employer], [employer], [place] 11/30/2015, 3:24 PM [phone]

## 2015-11-30 NOTE — Progress Notes (Signed)
*  PRELIMINARY RESULTS* Echocardiogram 2D Echocardiogram has been performed.  Stacey DrainWhite, Trinika Cortese J 11/30/2015, 3:49 PM

## 2015-11-30 NOTE — Progress Notes (Signed)
PROGRESS NOTE                                                                                                                                                                                                             Patient Demographics:    Chris Robinson, is a 60 y.o. male, DOB - 1955/08/04, WUJ:[id number]  Admit date - 11/29/2015   Admitting Physician Clydia Llano, MD  Outpatient Primary MD for the patient is No PCP Per Patient  LOS - 1  Chief Complaint  Patient presents with  . Fall       Brief Narrative     Chris Robinson is a 60 y.o. male with medical history significant of CVA and cigarettes smoking. Patient came to the hospital because of right hip pain. Patient reported that he was trying to help his wife who fell on Sunday and because of his balance issues secondary to his previous stroke he fell himself and landed on his right hip he developed severe pain but he was able to crawl back to his bed. He decided to come today to seek medical help, because he couldn't walk on it. X-ray showed mildly comminuted intertrochanteric right hip fracture   Subjective:    Chris Robinson today has, No headache, No chest pain, No abdominal pain - No Nausea, No new weakness tingling or numbness, No Cough - SOB.    Assessment  & Plan :     1.Mechanical fall with right intertrochanteric hip fracture. Orthopedics on board, plan to operate 12/01/2015,  Cardio-Pulm Risk stratification for surgery and recommendations to minimize the same:-  A.Cardio-Pulmonary Risk -  this patient is a low to moderate risk  for adverse Cardio-Pulmonary  Outcome  from surgery, the risks and benefits were discussed and acceptable to the patient  Recommendations for optimizing Cardio-Pulmonary  Risk risk factors  1. Keep SBP<140, HR<85, use Lopressor 5mg  IV q4hrs PRN, or B.Blocker drip PRN. 2. Moniotr I&Os. 3. Minimal sedation and Narcotics. 4. Good  pulmunary toilet. 5. PRN Nebs and as needed oxygen to keep Pox>90% 6. Hb>8, transfuse as needed- Lasix 10mg  IV after each unit PRBC Transfused.   B.Bleeding Risk - no previous surgical complications, no easy bruising,  Antiplate meds ASA.   Lab Results  Component Value Date   PLT 233 11/30/2015  Lab Results  Component Value Date   INR 0.93 11/29/2015      Will request Surgeon to please Order DVT prophylaxis of his/her choice, along with activity, weight bearing precautions and diet if appropriate.     2.Incidental finding of ST depression on EKG done in the ER, chest pain-free, repeat EKG, check baseline echocardiogram. He does smoke but has no history of CAD, good mets, can climb 2 flights of stairs without any chest pain, fairly active at home.  3. Smoking. Counseled to quit.  4. History of stroke. On aspirin and continue.  5. Hypokalemia and hypomagnesemia. Both replaced will monitor.    Family Communication  :  none  Code Status : Full  Diet : Heart Healthy  Disposition Plan  :  Stay inpt  Consults  :  Ortho  Procedures  :    TTE  DVT Prophylaxis  :  Heparin    Lab Results  Component Value Date   PLT 233 11/30/2015    Inpatient Medications  Scheduled Meds: . aspirin  325 mg Oral Daily  . heparin  5,000 Units Subcutaneous Q8H  . potassium chloride  40 mEq Oral BID   Continuous Infusions: . [START ON 12/01/2015] sodium chloride     PRN Meds:.HYDROcodone-acetaminophen, morphine injection  Antibiotics  :    Anti-infectives    None         Objective:   Vitals:   11/29/15 2200 11/30/15 0124 11/30/15 0530 11/30/15 0930  BP:  130/73 132/67 (!) 158/81  Pulse:  100 97 92  Resp:  18 18 18   Temp:  98.4 F (36.9 C) 98 F (36.7 C) 98.1 F (36.7 C)  TempSrc:  Oral Oral Oral  SpO2: 96% 96% 95% 93%  Weight:      Height:        Wt Readings from Last 3 Encounters:  11/29/15 75.9 kg (167 lb 5.3 oz)     Intake/Output  Summary (Last 24 hours) at 11/30/15 1116 Last data filed at 11/30/15 1015  Gross per 24 hour  Intake          1328.75 ml  Output             1250 ml  Net            78.75 ml     Physical Exam  Awake Alert, Oriented X 3, No new F.N deficits, Normal affect [place].AT,PERRAL Supple Neck,No JVD, No cervical lymphadenopathy appriciated.  Symmetrical Chest wall movement, Good air movement bilaterally, CTAB RRR,No Gallops,Rubs or new Murmurs, No Parasternal Heave +ve B.Sounds, Abd Soft, No tenderness, No organomegaly appriciated, No rebound - guarding or rigidity. No Cyanosis, Clubbing or edema, No new Rash or bruise , L LEG EXT ROTATED    Data Review:    CBC  Recent Labs Lab 11/29/15 1152 11/30/15 0535  WBC 10.5 8.3  HGB 12.5* 11.1*  HCT 35.3* 32.1*  PLT 257 233  MCV 118.9* 119.8*  MCH 42.1* 41.4*  MCHC 35.4 34.6  RDW 14.6 13.1  LYMPHSABS 0.8  --   MONOABS 1.4*  --   EOSABS 0.0  --   BASOSABS 0.0  --     Chemistries   Recent Labs Lab 11/29/15 1152 11/30/15 0535  NA 134* 134*  K 2.9* 3.2*  CL 94* 97*  CO2 28 28  GLUCOSE 112* 115*  BUN 14 14  CREATININE 0.67 0.59*  CALCIUM 8.6* 8.0*  MG  --  1.4*  AST 31  --  ALT 27  --   ALKPHOS 60  --   BILITOT 2.0*  --    ------------------------------------------------------------------------------------------------------------------ No results for input(s): CHOL, HDL, LDLCALC, TRIG, CHOLHDL, LDLDIRECT in the last 72 hours.  No results found for: HGBA1C ------------------------------------------------------------------------------------------------------------------ No results for input(s): TSH, T4TOTAL, T3FREE, THYROIDAB in the last 72 hours.  Invalid input(s): FREET3 ------------------------------------------------------------------------------------------------------------------ No results for input(s): VITAMINB12, FOLATE, FERRITIN, TIBC, IRON, RETICCTPCT in the last 72 hours.  Coagulation profile  Recent  Labs Lab 11/29/15 1152  INR 0.93    No results for input(s): DDIMER in the last 72 hours.  Cardiac Enzymes No results for input(s): CKMB, TROPONINI, MYOGLOBIN in the last 168 hours.  Invalid input(s): CK ------------------------------------------------------------------------------------------------------------------ No results found for: BNP  Micro Results Recent Results (from the past 240 hour(s))  Surgical PCR screen     Status: None   Collection Time: 11/30/15  5:27 AM  Result Value Ref Range Status   MRSA, PCR NEGATIVE NEGATIVE Final   Staphylococcus aureus NEGATIVE NEGATIVE Final    Comment:        The Xpert SA Assay (FDA approved for NASAL specimens in patients over 60 years of age), is one component of a comprehensive surveillance program.  Test performance has been validated by [facility] for patients greater than or equal to 60 year old. It is not intended to diagnose infection nor to guide or monitor treatment.     Radiology Reports Dg Chest 1 View  Result Date: 11/29/2015 CLINICAL DATA:  60 year old male status post falls. Hip fracture. Preoperative study. Initial encounter. EXAM: CHEST 1 VIEW COMPARISON:  Chest radiographs 06/24/2003 FINDINGS: AP supine view at 1216 hours.Cardiac size at the upper limits of normal to mildly enlarged. Other mediastinal contours are within normal limits. Visualized tracheal air column is within normal limits. Allowing for technique the lungs are clear. No pneumothorax or effusion. IMPRESSION: No acute cardiopulmonary abnormality. Borderline to mild cardiomegaly. Electronically Signed   By: Odessa FlemingH  Hall M.D.   On: 11/29/2015 12:50   Dg Shoulder Left  Result Date: 11/29/2015 CLINICAL DATA:  Acute left shoulder pain following fall. Initial encounter. EXAM: LEFT SHOULDER - 2+ VIEW COMPARISON:  None. FINDINGS: A mildly comminuted fracture of the lateral posterior humeral head/ greater tuberosity identified. There is no evidence of  subluxation or dislocation. No suspicious focal bony lesions are noted. No other fractures identified. IMPRESSION: Mildly comminuted lateral posterior humeral head/ greater tuberosity fracture. Electronically Signed   By: Harmon PierJeffrey  Hu M.D.   On: 11/29/2015 12:52   Dg Hip Unilat W Or Wo Pelvis 2-3 Views Right  Result Date: 11/29/2015 CLINICAL DATA:  Acute right hip pain following fall 2 days ago. Initial encounter. EXAM: DG HIP (WITH OR WITHOUT PELVIS) 2-3V RIGHT COMPARISON:  None. FINDINGS: A mildly comminuted intertrochanteric fracture the proximal right femur noted without significant angulation identified. There is no evidence of subluxation or dislocation. Mild degenerative changes in the hips are present bilaterally. No suspicious focal bony lesions are noted. IMPRESSION: Mildly comminuted right intertrochanteric femur fracture. Electronically Signed   By: Harmon PierJeffrey  Hu M.D.   On: 11/29/2015 12:50    Time Spent in minutes  30   Makinzey Banes K M.D on 11/30/2015 at 11:16 AM  Between 7am to 7pm - Pager - [phone]  After 7pm go to [email or web address] - password [email or web address]  [facility] -  Office  [phone]

## 2015-12-01 ENCOUNTER — Encounter (HOSPITAL_COMMUNITY): Payer: Self-pay | Admitting: *Deleted

## 2015-12-01 ENCOUNTER — Inpatient Hospital Stay (HOSPITAL_COMMUNITY): Payer: Self-pay | Admitting: Anesthesiology

## 2015-12-01 ENCOUNTER — Inpatient Hospital Stay (HOSPITAL_COMMUNITY): Payer: Self-pay

## 2015-12-01 ENCOUNTER — Encounter (HOSPITAL_COMMUNITY)
Admission: EM | Disposition: A | Discharge status: Home Health | Payer: Self-pay | Source: Home / Self Care | Attending: Internal Medicine

## 2015-12-01 DIAGNOSIS — E440 Moderate protein-calorie malnutrition: Secondary | ICD-10-CM | POA: Insufficient documentation

## 2015-12-01 HISTORY — PX: INTRAMEDULLARY (IM) NAIL INTERTROCHANTERIC: SHX5875

## 2015-12-01 LAB — BASIC METABOLIC PANEL
Anion gap: 10 (ref 5–15)
BUN: 12 mg/dL (ref 6–20)
CALCIUM: 8.2 mg/dL — AB (ref 8.9–10.3)
CO2: 25 mmol/L (ref 22–32)
CREATININE: 0.52 mg/dL — AB (ref 0.61–1.24)
Chloride: 99 mmol/L — ABNORMAL LOW (ref 101–111)
GFR calc Af Amer: >60 mL/min (ref >60)
GLUCOSE: 108 mg/dL — AB (ref 65–99)
Potassium: 3.7 mmol/L (ref 3.5–5.1)
Sodium: 134 mmol/L — ABNORMAL LOW (ref 135–145)

## 2015-12-01 LAB — CBC
HCT: 32.9 % — ABNORMAL LOW (ref 39.0–52.0)
Hemoglobin: 11.2 g/dL — ABNORMAL LOW (ref 13.0–17.0)
MCH: 41.2 pg — ABNORMAL HIGH (ref 26.0–34.0)
MCHC: 34 g/dL (ref 30.0–36.0)
MCV: 121 fL — ABNORMAL HIGH (ref 78.0–100.0)
Platelets: 298 10*3/uL (ref 150–400)
RBC: 2.72 MIL/uL — ABNORMAL LOW (ref 4.22–5.81)
RDW: 14.3 % (ref 11.5–15.5)
WBC: 9.5 10*3/uL (ref 4.0–10.5)

## 2015-12-01 LAB — MAGNESIUM: MAGNESIUM: 1.7 mg/dL (ref 1.7–2.4)

## 2015-12-01 SURGERY — FIXATION, FRACTURE, INTERTROCHANTERIC, WITH INTRAMEDULLARY ROD
Anesthesia: General | Site: Hip | Laterality: Right

## 2015-12-01 MED ORDER — METOPROLOL TARTRATE 5 MG/5ML IV SOLN: 5.0000 mg | INTRAVENOUS | Status: DC | PRN

## 2015-12-01 MED ORDER — ROCURONIUM BROMIDE 50 MG/5ML IV SOLN
INTRAVENOUS | Status: AC
  Filled 2015-12-01: qty 1

## 2015-12-01 MED ORDER — CEFAZOLIN SODIUM-DEXTROSE 2-4 GM/100ML-% IV SOLN
INTRAVENOUS | Status: AC
  Filled 2015-12-01: qty 100

## 2015-12-01 MED ORDER — MENTHOL 3 MG MT LOZG: 1.0000 | LOZENGE | OROMUCOSAL | Status: DC | PRN

## 2015-12-01 MED ORDER — HALOPERIDOL LACTATE 5 MG/ML IJ SOLN
2.0000 mg | Freq: Four times a day (QID) | INTRAMUSCULAR | Status: DC | PRN
  Administered 2015-12-01: 2 mg via INTRAMUSCULAR
  Filled 2015-12-01: qty 1

## 2015-12-01 MED ORDER — POLYETHYLENE GLYCOL 3350 17 G PO PACK
17.0000 g | PACK | Freq: Every day | ORAL | Status: DC
  Administered 2015-12-01 – 2015-12-02 (×2): 17 g via ORAL
  Filled 2015-12-01 (×3): qty 1

## 2015-12-01 MED ORDER — CHLORHEXIDINE GLUCONATE 4 % EX LIQD
60.0000 mL | Freq: Once | CUTANEOUS | Status: AC
  Administered 2015-12-01: 4 via TOPICAL
  Filled 2015-12-01: qty 15

## 2015-12-01 MED ORDER — SODIUM CHLORIDE 0.9 % IR SOLN
Status: DC | PRN
Start: 2015-12-01 — End: 2015-12-01
  Administered 2015-12-01: 1000 mL

## 2015-12-01 MED ORDER — LIDOCAINE HCL (CARDIAC) 10 MG/ML IV SOLN
INTRAVENOUS | Status: DC | PRN
  Administered 2015-12-01: 40 mg via INTRAVENOUS

## 2015-12-01 MED ORDER — ACETAMINOPHEN 325 MG PO TABS
650.0000 mg | ORAL_TABLET | Freq: Four times a day (QID) | ORAL | Status: DC | PRN
  Administered 2015-12-02: 650 mg via ORAL
  Filled 2015-12-01: qty 2

## 2015-12-01 MED ORDER — BUPIVACAINE-EPINEPHRINE (PF) 0.5% -1:200000 IJ SOLN
INTRAMUSCULAR | Status: AC
Start: 2015-12-01 — End: 2015-12-01
  Filled 2015-12-01: qty 60

## 2015-12-01 MED ORDER — PROPOFOL 10 MG/ML IV BOLUS
INTRAVENOUS | Status: AC
  Filled 2015-12-01: qty 20

## 2015-12-01 MED ORDER — OXYCODONE HCL 5 MG PO TABS: 5.0000 mg | ORAL_TABLET | Freq: Once | ORAL | Status: DC | PRN

## 2015-12-01 MED ORDER — FENTANYL CITRATE (PF) 250 MCG/5ML IJ SOLN
INTRAMUSCULAR | Status: AC
  Filled 2015-12-01: qty 5

## 2015-12-01 MED ORDER — ROCURONIUM 10MG/ML (10ML) SYRINGE FOR MEDFUSION PUMP - OPTIME
INTRAVENOUS | Status: DC | PRN
  Administered 2015-12-01: 5 mg via INTRAVENOUS
  Administered 2015-12-01: 10 mg via INTRAVENOUS

## 2015-12-01 MED ORDER — LIDOCAINE HCL (PF) 1 % IJ SOLN
INTRAMUSCULAR | Status: AC
  Filled 2015-12-01: qty 5

## 2015-12-01 MED ORDER — CEFAZOLIN SODIUM-DEXTROSE 2-4 GM/100ML-% IV SOLN: 2.0000 g | Freq: Four times a day (QID) | INTRAVENOUS | Status: DC

## 2015-12-01 MED ORDER — ACETAMINOPHEN 650 MG RE SUPP: 650.0000 mg | Freq: Four times a day (QID) | RECTAL | Status: DC | PRN

## 2015-12-01 MED ORDER — LACTATED RINGERS IV SOLN
INTRAVENOUS | Status: DC
  Administered 2015-12-01: 14:00:00 via INTRAVENOUS
  Administered 2015-12-01: 1000 mL via INTRAVENOUS

## 2015-12-01 MED ORDER — ONDANSETRON HCL 4 MG/2ML IJ SOLN: 4.0000 mg | Freq: Four times a day (QID) | INTRAMUSCULAR | Status: DC | PRN

## 2015-12-01 MED ORDER — ALUM & MAG HYDROXIDE-SIMETH 200-200-20 MG/5ML PO SUSP: 30.0000 mL | ORAL | Status: DC | PRN

## 2015-12-01 MED ORDER — METOCLOPRAMIDE HCL 5 MG/ML IJ SOLN: 5.0000 mg | Freq: Three times a day (TID) | INTRAMUSCULAR | Status: DC | PRN

## 2015-12-01 MED ORDER — PHENOL 1.4 % MT LIQD
1.0000 | OROMUCOSAL | Status: DC | PRN
Start: 2015-12-01 — End: 2015-12-03

## 2015-12-01 MED ORDER — OXYCODONE HCL 5 MG/5ML PO SOLN: 5.0000 mg | Freq: Once | ORAL | Status: DC | PRN

## 2015-12-01 MED ORDER — SUCCINYLCHOLINE 20MG/ML (10ML) SYRINGE FOR MEDFUSION PUMP - OPTIME
INTRAMUSCULAR | Status: DC | PRN
  Administered 2015-12-01: 120 mg via INTRAVENOUS

## 2015-12-01 MED ORDER — PROPOFOL 10 MG/ML IV BOLUS
INTRAVENOUS | Status: DC | PRN
  Administered 2015-12-01: 180 mg via INTRAVENOUS

## 2015-12-01 MED ORDER — ASPIRIN EC 325 MG PO TBEC: 325.0000 mg | DELAYED_RELEASE_TABLET | Freq: Every day | ORAL | Status: DC

## 2015-12-01 MED ORDER — FENTANYL CITRATE (PF) 100 MCG/2ML IJ SOLN
INTRAMUSCULAR | Status: DC | PRN
  Administered 2015-12-01 (×10): 50 ug via INTRAVENOUS

## 2015-12-01 MED ORDER — CEFAZOLIN SODIUM-DEXTROSE 2-4 GM/100ML-% IV SOLN
2.0000 g | Freq: Four times a day (QID) | INTRAVENOUS | Status: AC
  Administered 2015-12-01 (×2): 2 g via INTRAVENOUS
  Filled 2015-12-01 (×2): qty 100

## 2015-12-01 MED ORDER — MIDAZOLAM HCL 5 MG/5ML IJ SOLN
INTRAMUSCULAR | Status: DC | PRN
  Administered 2015-12-01: 2 mg via INTRAVENOUS

## 2015-12-01 MED ORDER — ONDANSETRON HCL 4 MG PO TABS: 4.0000 mg | ORAL_TABLET | Freq: Four times a day (QID) | ORAL | Status: DC | PRN

## 2015-12-01 MED ORDER — SUCCINYLCHOLINE CHLORIDE 20 MG/ML IJ SOLN
INTRAMUSCULAR | Status: AC
  Filled 2015-12-01: qty 1

## 2015-12-01 MED ORDER — CEFAZOLIN SODIUM-DEXTROSE 2-4 GM/100ML-% IV SOLN
2.0000 g | INTRAVENOUS | Status: AC
  Administered 2015-12-01: 2 g via INTRAVENOUS
  Filled 2015-12-01 (×2): qty 100

## 2015-12-01 MED ORDER — MIDAZOLAM HCL 2 MG/2ML IJ SOLN
INTRAMUSCULAR | Status: AC
  Filled 2015-12-01: qty 2

## 2015-12-01 MED ORDER — METOCLOPRAMIDE HCL 10 MG PO TABS: 5.0000 mg | ORAL_TABLET | Freq: Three times a day (TID) | ORAL | Status: DC | PRN

## 2015-12-01 MED ORDER — POVIDONE-IODINE 10 % EX SWAB
2.0000 "application " | Freq: Once | CUTANEOUS | Status: AC
  Administered 2015-12-01: 2 via TOPICAL

## 2015-12-01 MED ORDER — CHLORHEXIDINE GLUCONATE 4 % EX LIQD
60.0000 mL | Freq: Once | CUTANEOUS | Status: AC
  Administered 2015-12-01: 4 via TOPICAL

## 2015-12-01 MED ORDER — BUPIVACAINE-EPINEPHRINE (PF) 0.5% -1:200000 IJ SOLN
INTRAMUSCULAR | Status: DC | PRN
  Administered 2015-12-01: 30 mL

## 2015-12-01 MED ORDER — HYDROMORPHONE HCL 1 MG/ML IJ SOLN: 0.2500 mg | INTRAMUSCULAR | Status: DC | PRN

## 2015-12-01 SURGICAL SUPPLY — 52 items
BAG HAMPER (MISCELLANEOUS) ×3 IMPLANT
BIT DRILL AO GAMMA 4.2X300 (BIT) ×3 IMPLANT
BLADE HEX COATED 2.75 (ELECTRODE) ×3 IMPLANT
BLADE SURG SZ10 CARB STEEL (BLADE) ×6 IMPLANT
BNDG GAUZE ELAST 4 BULKY (GAUZE/BANDAGES/DRESSINGS) ×3 IMPLANT
CHLORAPREP W/TINT 26ML (MISCELLANEOUS) ×3 IMPLANT
CLOTH BEACON ORANGE TIMEOUT ST (SAFETY) ×3 IMPLANT
COVER LIGHT HANDLE STERIS (MISCELLANEOUS) ×6 IMPLANT
COVER MAYO STAND XLG (DRAPE) ×3 IMPLANT
DECANTER SPIKE VIAL GLASS SM (MISCELLANEOUS) ×6 IMPLANT
DRAPE STERI IOBAN 125X83 (DRAPES) ×3 IMPLANT
DRSG AQUACEL AG ADV 3.5X10 (GAUZE/BANDAGES/DRESSINGS) ×1 IMPLANT
DRSG MEPILEX BORDER 4X12 (GAUZE/BANDAGES/DRESSINGS) ×3 IMPLANT
ELECT REM PT RETURN 9FT ADLT (ELECTROSURGICAL) ×3
ELECTRODE REM PT RTRN 9FT ADLT (ELECTROSURGICAL) ×1 IMPLANT
GLOVE BIOGEL PI IND STRL 7.0 (GLOVE) ×1 IMPLANT
GLOVE BIOGEL PI INDICATOR 7.0 (GLOVE) ×2
GLOVE SKINSENSE NS SZ8.0 LF (GLOVE) ×2
GLOVE SKINSENSE STRL SZ8.0 LF (GLOVE) ×1 IMPLANT
GLOVE SS N UNI LF 8.5 STRL (GLOVE) ×3 IMPLANT
GOWN STRL REUS W/TWL LRG LVL3 (GOWN DISPOSABLE) ×9 IMPLANT
GOWN STRL REUS W/TWL XL LVL3 (GOWN DISPOSABLE) ×3 IMPLANT
GUIDEROD T2 3X1000 (ROD) ×3 IMPLANT
INST SET MAJOR BONE (KITS) ×3 IMPLANT
K-WIRE  3.2X450M STR (WIRE) ×2
K-WIRE 3.2X450M STR (WIRE) ×1
KIT BLADEGUARD II DBL (SET/KITS/TRAYS/PACK) ×3 IMPLANT
KIT ROOM TURNOVER AP CYSTO (KITS) ×3 IMPLANT
KWIRE 3.2X450M STR (WIRE) ×1 IMPLANT
MANIFOLD NEPTUNE II (INSTRUMENTS) ×3 IMPLANT
MARKER SKIN DUAL TIP RULER LAB (MISCELLANEOUS) ×3 IMPLANT
NAIL TROCH GAMMA 11X18 (Nail) ×2 IMPLANT
NDL HYPO 21X1.5 SAFETY (NEEDLE) ×1 IMPLANT
NDL SPNL 18GX3.5 QUINCKE PK (NEEDLE) ×1 IMPLANT
NEEDLE HYPO 21X1.5 SAFETY (NEEDLE) ×3 IMPLANT
NEEDLE SPNL 18GX3.5 QUINCKE PK (NEEDLE) ×3 IMPLANT
NS IRRIG 1000ML POUR BTL (IV SOLUTION) ×3 IMPLANT
PACK BASIC III (CUSTOM PROCEDURE TRAY) ×3
PACK SRG BSC III STRL LF ECLPS (CUSTOM PROCEDURE TRAY) ×1 IMPLANT
PENCIL HANDSWITCHING (ELECTRODE) ×3 IMPLANT
SCREW LAG GAMMA 3 TI 10.5X105M (Screw) ×2 IMPLANT
SCREW LOCKING T2 F/T  5MMX40MM (Screw) ×2 IMPLANT
SCREW LOCKING T2 F/T 5MMX40MM (Screw) IMPLANT
SET BASIN LINEN APH (SET/KITS/TRAYS/PACK) ×3 IMPLANT
SPONGE LAP 18X18 X RAY DECT (DISPOSABLE) ×6 IMPLANT
STAPLER VISISTAT 35W (STAPLE) ×2 IMPLANT
SUT MNCRL 0 VIOLET CTX 36 (SUTURE) ×1 IMPLANT
SUT MON AB 2-0 CT1 36 (SUTURE) ×3 IMPLANT
SUT MONOCRYL 0 CTX 36 (SUTURE) ×2
SYR 30ML LL (SYRINGE) ×3 IMPLANT
SYR BULB IRRIGATION 50ML (SYRINGE) ×6 IMPLANT
YANKAUER SUCT BULB TIP NO VENT (SUCTIONS) ×3 IMPLANT

## 2015-12-01 NOTE — Brief Op Note (Signed)
11/29/2015 - 12/01/2015  3:01 PM  PATIENT:  Chris Robinson  60 y.o. male  PRE-OPERATIVE DIAGNOSIS:  RIGHT HIP INTERTROCHANTERIC FRACTURE  POST-OPERATIVE DIAGNOSIS:  RIGHT HIP INTERTROCHANTERIC FRACTURE   PROCEDURE:  Procedure(s) with comments: INTRAMEDULLARY (IM) NAIL INTERTROCHANTRIC (Right) - TO FOLLOW    GAMMA NAIL 125 DEGREE SHORT NAIL 105 LAG SCREW  40 MM DISTAL LOCK SCREW  ACORN IN SLIDE MODE   SURGEON:  Surgeon(s) and Role:    * Vickki Hearing, MD - Primary  PHYSICIAN ASSISTANT:   ASSISTANTS: BETTY ASHLEY    ANESTHESIA:   general  EBL:  Total I/O In: -  Out: 150 [Urine:50; Blood:100]  BLOOD ADMINISTERED:none  DRAINS: none   LOCAL MEDICATIONS USED:  MARCAINE     SPECIMEN:  No Specimen  DISPOSITION OF SPECIMEN:  N/A  COUNTS:  YES  TOURNIQUET:  * No tourniquets in log *  DICTATION: .Dragon Dictation  PLAN OF CARE: Admit to inpatient   PATIENT DISPOSITION:  PACU - hemodynamically stable.   Delay start of Pharmacological VTE agent (>24hrs) due to surgical blood loss or risk of bleeding: yes  Dictation of procedure  We found a two-part fracture of the peritrochanteric region of the right femur there were some fracture lines running into the lesser trochanter but they were incomplete and some of the greater trochanter had some comminution but there were 2 main fragments in the posterior medial buttress was intact  The patient's chart was reviewed and Chris Robinson was marked over his right hip as a right hip surgical site. He was taken to surgery. He had appropriate Ancef antibiotic and he was given his general anesthesia. On the fracture table we placed his injured leg in traction and his well leg in abduction external rotation in a padded leg holder.  We brought the C-arm in and took some provisional x-rays. X-rays show that the leg was externally rotated slightly shortened so we gave some traction and internal rotation and I was able to obtain a stable  reduction  The leg was prepped and draped sterilely timeout was completed.  I made the incision over the greater trochanter extended proximally went down to the fascial layer. I split the fascia in line with the skin incision and identified the greater trochanters. I split the abductor musculature by finger dissection and placed a curved awl in the femur canal and this was confirmed by AP and lateral x-ray  We then passed a guide rod into the femoral canal overreamed the guidewire per manufacture technique and then passed 125 nail. A second stab wound was made slightly distal to the first incision and this allowed the cannula to be taken down to bone. We used a starter drill to open the cortex and then past the threaded tip guide rod in the center the femoral head on AP and lateral x-ray. This measured 105 mm. We set the triple reamer for 105 mm and then reamed up to that level.  The lag screw was passed. The acorn was plaque passed proximally and placed in dynamic mode and checked for engagement by toggling the screw driver  We then passed the cannula through the distal locking mechanism on the guide and passed this down to bone drilled and then placed a 40 mm locking screw  Our final x-rays confirmed reduction and hardware position. All were satisfactory. We thoroughly irrigated all the wounds closed the proximal wound with 2 layers of 0 Monocryl the 2 distal incisions with one layer of  0 Monocryl we injected 30 mL of Marcaine with epinephrine we applied a sterile dressing  The patient was taken to the recovery room in stable condition  The postoperative plan is  Weightbearing status as tolerated  Staples out postop day 14  Doctor visits on postop day 14/15, 6 weeks and 12 weeks with x-rays at each visit  DVT prophylaxis aspirin for 28 days 325 mg daily  [id number]

## 2015-12-01 NOTE — Anesthesia Postprocedure Evaluation (Signed)
Anesthesia Post Note  Patient: Chris DeisCraig S Ranta  Procedure(s) Performed: Procedure(s) (LRB): INTRAMEDULLARY (IM) NAIL INTERTROCHANTRIC (Right)  Patient location during evaluation: PACU Anesthesia Type: General Level of consciousness: awake and alert and patient cooperative Pain management: pain level controlled Vital Signs Assessment: post-procedure vital signs reviewed and stable Respiratory status: spontaneous breathing and respiratory function stable Cardiovascular status: stable Anesthetic complications: no    Last Vitals:  Vitals:   12/01/15 1506 12/01/15 1515  BP: (!) 160/100 (!) 161/94  Pulse: (!) 25 (!) 105  Resp:  11  Temp: 36.4 C     Last Pain:  Vitals:   12/01/15 1506  TempSrc:   PainSc: 9                  Torianna Junio S

## 2015-12-01 NOTE — Transfer of Care (Signed)
Immediate Anesthesia Transfer of Care Note  Patient: Chris Robinson  Procedure(s) Performed: Procedure(s) with comments: INTRAMEDULLARY (IM) NAIL INTERTROCHANTRIC (Right) - TO FOLLOW   Patient Location: PACU  Anesthesia Type:General  Level of Consciousness: awake, alert  and oriented  Airway & Oxygen Therapy: Patient Spontanous Breathing and Patient connected to face mask oxygen  Post-op Assessment: Report given to RN  Post vital signs: Reviewed and stable  Last Vitals:  Vitals:   12/01/15 1330 12/01/15 1335  BP:    Pulse:    Resp: 15 12  Temp:      Last Pain:  Vitals:   12/01/15 1231  TempSrc: Oral  PainSc: 3       Patients Stated Pain Goal: 4 (12/01/15 1231)  Complications: No apparent anesthesia complications

## 2015-12-01 NOTE — Progress Notes (Addendum)
PROGRESS NOTE                                                                                                                                                                                                             Patient Demographics:    Chris Robinson, is a 60 y.o. male, DOB - 1955/11/10, XBJ:[id number]  Admit date - 11/29/2015   Admitting Physician Clydia Llano, MD  Outpatient Primary MD for the patient is No PCP Per Patient  LOS - 2  Chief Complaint  Patient presents with  . Fall       Brief Narrative     Chris Robinson is a 60 y.o. male with medical history significant of CVA and cigarettes smoking. Patient came to the hospital because of right hip pain. Patient reported that he was trying to help his wife who fell on Sunday and because of his balance issues secondary to his previous stroke he fell himself and landed on his right hip he developed severe pain but he was able to crawl back to his bed. He decided to come today to seek medical help, because he couldn't walk on it. X-ray showed mildly comminuted intertrochanteric right hip fracture   Subjective:    Chris Robinson today has, No headache, No chest pain, No abdominal pain - No Nausea, No new weakness tingling or numbness, No Cough - SOB. Mildly agitated.   Assessment  & Plan :     1.Mechanical fall with right intertrochanteric hip fracture. Orthopedics on board, plan to operate 12/01/2015, Cardio-Pulm Risk stratification for surgery and recommendations to minimize the same:-  A.Cardio-Pulmonary Risk -  this patient is a low to moderate risk  for adverse Cardio-Pulmonary  Outcome  from surgery, the risks and benefits were discussed and acceptable to the patient  Recommendations for optimizing Cardio-Pulmonary  Risk risk factors  1. Keep SBP<140, HR<85, use Lopressor 5mg  IV q4hrs PRN, or B.Blocker drip PRN. 2. Moniotr I&Os. 3. Minimal sedation and  Narcotics. 4. Good pulmunary toilet. 5. PRN Nebs and as needed oxygen to keep Pox>90% 6. Hb>8, transfuse as needed - Lasix 10mg  IV after each unit PRBC Transfused.   B.Bleeding Risk - no previous surgical complications, no easy bruising,  Antiplate meds ASA.   Lab Results  Component Value Date   PLT 298 12/01/2015  Lab Results  Component Value Date   INR 0.93 11/29/2015   Will request Surgeon to please Order DVT prophylaxis of his/her choice, along with activity, weight bearing precautions and diet if appropriate.     2.Incidental finding of ST depression on EKG done in the ER, chest pain-free, repeat EKG improved, stable baseline echocardiogram with preserved EF of 65% and no wall motion abnormality. He does smoke but has no history of CAD, good mets, can climb 2 flights of stairs without any chest pain, fairly active at home.  3. Smoking. Counseled to quit.  4. History of stroke. On aspirin continue.  5. Hypokalemia and hypomagnesemia. Both replaced & stable.  6. Mild Delirium - Haldol PRN    Family Communication  :  none  Code Status : Full  Diet : Heart Healthy  Disposition Plan  :  Stay inpt  Consults  :  Ortho  Procedures  :    TTE - The cavity size was normal. Systolic function was vigorous. The estimated ejection fraction was in the range of 65% to 70%. Wall motion was normal; there were no regional wall motion abnormalities. Very mild concentric left ventricular hypertrophy. There was a borderline abnormality in the ratio of early to atrial left ventricular filling. The pulmonary vein flow pattern was normal.  DVT Prophylaxis  :  Heparin    Lab Results  Component Value Date   PLT 298 12/01/2015    Inpatient Medications  Scheduled Meds: . aspirin  325 mg Oral Daily  .  ceFAZolin (ANCEF) IV  2 g Intravenous On Call to OR  . feeding supplement (ENSURE ENLIVE)  237 mL Oral Q24H  . heparin  5,000 Units Subcutaneous Q8H  . potassium  chloride  40 mEq Oral BID  . povidone-iodine  2 application Topical Once   Continuous Infusions: . sodium chloride 75 mL/hr at 11/30/15 2205   PRN Meds:.haloperidol lactate, HYDROcodone-acetaminophen, morphine injection  Antibiotics  :    Anti-infectives    Start     Dose/Rate Route Frequency Ordered Stop   12/01/15 0216  ceFAZolin (ANCEF) IVPB 2g/100 mL premix     2 g 200 mL/hr over 30 Minutes Intravenous On call to O.R. 12/01/15 0216 12/02/15 0559         Objective:   Vitals:   11/30/15 0930 11/30/15 1330 11/30/15 1730 11/30/15 2335  BP: (!) 158/81 (!) 156/95 (!) 148/75 (!) 154/84  Pulse: 92 96 98 93  Resp: 18 18 18 18   Temp: 98.1 F (36.7 C) 99.1 F (37.3 C) 98.9 F (37.2 C) 98.9 F (37.2 C)  TempSrc: Oral Oral Oral Oral  SpO2: 93% 97% 98% 98%  Weight:      Height:        Wt Readings from Last 3 Encounters:  11/29/15 75.9 kg (167 lb 5.3 oz)     Intake/Output Summary (Last 24 hours) at 12/01/15 0955 Last data filed at 12/01/15 0300  Gross per 24 hour  Intake           848.75 ml  Output              650 ml  Net           198.75 ml     Physical Exam  Awake Alert, Oriented X 3, No new F.N deficits, Normal affect [place].AT,PERRAL Supple Neck,No JVD, No cervical lymphadenopathy appriciated.  Symmetrical Chest wall movement, Good air movement bilaterally, CTAB RRR,No Gallops,Rubs or new Murmurs, No Parasternal Heave +ve B.Sounds,  Abd Soft, No tenderness, No organomegaly appriciated, No rebound - guarding or rigidity. No Cyanosis, Clubbing or edema, No new Rash or bruise , L LEG EXT ROTATED    Data Review:    CBC  Recent Labs Lab 11/29/15 1152 11/30/15 0535 12/01/15 0536  WBC 10.5 8.3 9.5  HGB 12.5* 11.1* 11.2*  HCT 35.3* 32.1* 32.9*  PLT 257 233 298  MCV 118.9* 119.8* 121.0*  MCH 42.1* 41.4* 41.2*  MCHC 35.4 34.6 34.0  RDW 14.6 13.1 14.3  LYMPHSABS 0.8  --   --   MONOABS 1.4*  --   --   EOSABS 0.0  --   --   BASOSABS 0.0  --   --      Chemistries   Recent Labs Lab 11/29/15 1152 11/30/15 0535 12/01/15 0536  NA 134* 134* 134*  K 2.9* 3.2* 3.7  CL 94* 97* 99*  CO2 28 28 25   GLUCOSE 112* 115* 108*  BUN 14 14 12   CREATININE 0.67 0.59* 0.52*  CALCIUM 8.6* 8.0* 8.2*  MG  --  1.4* 1.7  AST 31  --   --   ALT 27  --   --   ALKPHOS 60  --   --   BILITOT 2.0*  --   --    ------------------------------------------------------------------------------------------------------------------ No results for input(s): CHOL, HDL, LDLCALC, TRIG, CHOLHDL, LDLDIRECT in the last 72 hours.  No results found for: HGBA1C ------------------------------------------------------------------------------------------------------------------ No results for input(s): TSH, T4TOTAL, T3FREE, THYROIDAB in the last 72 hours.  Invalid input(s): FREET3 ------------------------------------------------------------------------------------------------------------------ No results for input(s): VITAMINB12, FOLATE, FERRITIN, TIBC, IRON, RETICCTPCT in the last 72 hours.  Coagulation profile  Recent Labs Lab 11/29/15 1152  INR 0.93    No results for input(s): DDIMER in the last 72 hours.  Cardiac Enzymes No results for input(s): CKMB, TROPONINI, MYOGLOBIN in the last 168 hours.  Invalid input(s): CK ------------------------------------------------------------------------------------------------------------------ No results found for: BNP  Micro Results Recent Results (from the past 240 hour(s))  Surgical PCR screen     Status: None   Collection Time: 11/30/15  5:27 AM  Result Value Ref Range Status   MRSA, PCR NEGATIVE NEGATIVE Final   Staphylococcus aureus NEGATIVE NEGATIVE Final    Comment:        The Xpert SA Assay (FDA approved for NASAL specimens in patients over 50 years of age), is one component of a comprehensive surveillance program.  Test performance has been validated by [facility] for patients greater than or equal  to 49 year old. It is not intended to diagnose infection nor to guide or monitor treatment.     Radiology Reports Dg Chest 1 View  Result Date: 11/29/2015 CLINICAL DATA:  60 year old male status post falls. Hip fracture. Preoperative study. Initial encounter. EXAM: CHEST 1 VIEW COMPARISON:  Chest radiographs 06/24/2003 FINDINGS: AP supine view at 1216 hours.Cardiac size at the upper limits of normal to mildly enlarged. Other mediastinal contours are within normal limits. Visualized tracheal air column is within normal limits. Allowing for technique the lungs are clear. No pneumothorax or effusion. IMPRESSION: No acute cardiopulmonary abnormality. Borderline to mild cardiomegaly. Electronically Signed   By: Odessa Fleming M.D.   On: 11/29/2015 12:50   Dg Shoulder Left  Result Date: 11/29/2015 CLINICAL DATA:  Acute left shoulder pain following fall. Initial encounter. EXAM: LEFT SHOULDER - 2+ VIEW COMPARISON:  None. FINDINGS: A mildly comminuted fracture of the lateral posterior humeral head/ greater tuberosity identified. There is no evidence of subluxation or dislocation.  No suspicious focal bony lesions are noted. No other fractures identified. IMPRESSION: Mildly comminuted lateral posterior humeral head/ greater tuberosity fracture. Electronically Signed   By: Harmon PierJeffrey  Hu M.D.   On: 11/29/2015 12:52   Dg Hip Unilat W Or Wo Pelvis 2-3 Views Right  Result Date: 11/29/2015 CLINICAL DATA:  Acute right hip pain following fall 2 days ago. Initial encounter. EXAM: DG HIP (WITH OR WITHOUT PELVIS) 2-3V RIGHT COMPARISON:  None. FINDINGS: A mildly comminuted intertrochanteric fracture the proximal right femur noted without significant angulation identified. There is no evidence of subluxation or dislocation. Mild degenerative changes in the hips are present bilaterally. No suspicious focal bony lesions are noted. IMPRESSION: Mildly comminuted right intertrochanteric femur fracture. Electronically Signed   By:  Harmon PierJeffrey  Hu M.D.   On: 11/29/2015 12:50    Time Spent in minutes  30   Chris Robinson K M.D on 12/01/2015 at 9:55 AM  Between 7am to 7pm - Pager - [phone]  After 7pm go to [email or web address] - password [email or web address]  [facility] -  Office  [phone]

## 2015-12-01 NOTE — Anesthesia Postprocedure Evaluation (Signed)
Anesthesia Post Note  Patient: Chris Robinson  Procedure(s) Performed: Procedure(s) (LRB): INTRAMEDULLARY (IM) NAIL INTERTROCHANTRIC (Right)  Patient location during evaluation: PACU Anesthesia Type: General Level of consciousness: awake and alert and oriented Pain management: pain level controlled Vital Signs Assessment: post-procedure vital signs reviewed and stable Respiratory status: spontaneous breathing Cardiovascular status: blood pressure returned to baseline Postop Assessment: no signs of nausea or vomiting Anesthetic complications: no    Last Vitals:  Vitals:   12/01/15 1506 12/01/15 1515  BP: (!) 160/100 (!) 161/94  Pulse: (!) 25 (!) 105  Resp:  11  Temp: 36.4 C     Last Pain:  Vitals:   12/01/15 1506  TempSrc:   PainSc: 9                  Kasson Lamere

## 2015-12-01 NOTE — Anesthesia Preprocedure Evaluation (Signed)
Anesthesia Evaluation  Patient identified by MRN, date of birth, ID band Patient awake    Reviewed: Allergy & Precautions, NPO status , Patient's Chart, lab work & pertinent test results  Airway Mallampati: II   Neck ROM: full    Dental   Pulmonary Current Smoker,    breath sounds clear to auscultation       Cardiovascular negative cardio ROS   Rhythm:regular Rate:Normal     Neuro/Psych CVA    GI/Hepatic   Endo/Other    Renal/GU      Musculoskeletal   Abdominal   Peds  Hematology   Anesthesia Other Findings   Reproductive/Obstetrics                             Anesthesia Physical Anesthesia Plan  ASA: III  Anesthesia Plan: General   Post-op Pain Management:    Induction: Intravenous  Airway Management Planned: Oral ETT  Additional Equipment:   Intra-op Plan:   Post-operative Plan: Extubation in OR  Informed Consent: I have reviewed the patients History and Physical, chart, labs and discussed the procedure including the risks, benefits and alternatives for the proposed anesthesia with the patient or authorized representative who has indicated his/her understanding and acceptance.     Plan Discussed with: CRNA, Anesthesiologist and Surgeon  Anesthesia Plan Comments:         Anesthesia Quick Evaluation

## 2015-12-01 NOTE — Anesthesia Procedure Notes (Signed)
Procedure Name: Intubation Date/Time: 12/01/2015 1:50 PM Performed by: Glynn OctaveANIEL, Cheron Pasquarelli E Pre-anesthesia Checklist: Patient identified, Patient being monitored, Timeout performed, Emergency Drugs available and Suction available Patient Re-evaluated:Patient Re-evaluated prior to inductionOxygen Delivery Method: Circle system utilized Preoxygenation: Pre-oxygenation with 100% oxygen Intubation Type: IV induction Ventilation: Mask ventilation without difficulty Laryngoscope Size: Mac, 3 and Miller Grade View: Grade II Tube type: Oral Tube size: 7.0 mm Number of attempts: 2 Airway Equipment and Method: Stylet Placement Confirmation: ETT inserted through vocal cords under direct vision,  positive ETCO2 and breath sounds checked- equal and bilateral Secured at: 22 cm Tube secured with: Tape Dental Injury: Teeth and Oropharynx as per pre-operative assessment

## 2015-12-01 NOTE — Op Note (Signed)
11/29/2015 - 12/01/2015  3:01 PM  PATIENT:  Chris Robinson  60 y.o. male  PRE-OPERATIVE DIAGNOSIS:  RIGHT HIP INTERTROCHANTERIC FRACTURE  POST-OPERATIVE DIAGNOSIS:  RIGHT HIP INTERTROCHANTERIC FRACTURE   PROCEDURE:  Procedure(s) with comments: INTRAMEDULLARY (IM) NAIL INTERTROCHANTRIC (Right) - TO FOLLOW    GAMMA NAIL 125 DEGREE SHORT NAIL 105 LAG SCREW  40 MM DISTAL LOCK SCREW  ACORN IN SLIDE MODE   SURGEON:  Surgeon(s) and Role:    * Vickki Hearing, MD - Primary  PHYSICIAN ASSISTANT:   ASSISTANTS: BETTY ASHLEY    ANESTHESIA:   general  EBL:  Total I/O In: -  Out: 150 [Urine:50; Blood:100]  BLOOD ADMINISTERED:none  DRAINS: none   LOCAL MEDICATIONS USED:  MARCAINE     SPECIMEN:  No Specimen  DISPOSITION OF SPECIMEN:  N/A  COUNTS:  YES  TOURNIQUET:  * No tourniquets in log *  DICTATION: .Dragon Dictation  PLAN OF CARE: Admit to inpatient   PATIENT DISPOSITION:  PACU - hemodynamically stable.   Delay start of Pharmacological VTE agent (>24hrs) due to surgical blood loss or risk of bleeding: yes  Dictation of procedure  We found a two-part fracture of the peritrochanteric region of the right femur there were some fracture lines running into the lesser trochanter but they were incomplete and some of the greater trochanter had some comminution but there were 2 main fragments in the posterior medial buttress was intact  The patient's chart was reviewed and Chris Robinson was marked over his right hip as a right hip surgical site. He was taken to surgery. He had appropriate Ancef antibiotic and he was given his general anesthesia. On the fracture table we placed his injured leg in traction and his well leg in abduction external rotation in a padded leg holder.  We brought the C-arm in and took some provisional x-rays. X-rays show that the leg was externally rotated slightly shortened so we gave some traction and internal rotation and I was able to obtain a stable  reduction  The leg was prepped and draped sterilely timeout was completed.  I made the incision over the greater trochanter extended proximally went down to the fascial layer. I split the fascia in line with the skin incision and identified the greater trochanters. I split the abductor musculature by finger dissection and placed a curved awl in the femur canal and this was confirmed by AP and lateral x-ray  We then passed a guide rod into the femoral canal overreamed the guidewire per manufacture technique and then passed 125 nail. A second stab wound was made slightly distal to the first incision and this allowed the cannula to be taken down to bone. We used a starter drill to open the cortex and then past the threaded tip guide rod in the center the femoral head on AP and lateral x-ray. This measured 105 mm. We set the triple reamer for 105 mm and then reamed up to that level.  The lag screw was passed. The acorn was plaque passed proximally and placed in dynamic mode and checked for engagement by toggling the screw driver  We then passed the cannula through the distal locking mechanism on the guide and passed this down to bone drilled and then placed a 40 mm locking screw  Our final x-rays confirmed reduction and hardware position. All were satisfactory. We thoroughly irrigated all the wounds closed the proximal wound with 2 layers of 0 Monocryl the 2 distal incisions with one layer of  0 Monocryl we injected 30 mL of Marcaine with epinephrine we applied a sterile dressing  The patient was taken to the recovery room in stable condition  The postoperative plan is  Weightbearing status as tolerated  Staples out postop day 14  Doctor visits on postop day 14/15, 6 weeks and 12 weeks with x-rays at each visit  DVT prophylaxis aspirin for 28 days 325 mg daily  [id number]

## 2015-12-02 LAB — CBC
HCT: 30 % — ABNORMAL LOW (ref 39.0–52.0)
Hemoglobin: 10.2 g/dL — ABNORMAL LOW (ref 13.0–17.0)
MCH: 40.6 pg — AB (ref 26.0–34.0)
MCHC: 34 g/dL (ref 30.0–36.0)
MCV: 119.5 fL — AB (ref 78.0–100.0)
PLATELETS: 302 10*3/uL (ref 150–400)
RBC: 2.51 MIL/uL — ABNORMAL LOW (ref 4.22–5.81)
RDW: 14.3 % (ref 11.5–15.5)
WBC: 10.2 10*3/uL (ref 4.0–10.5)

## 2015-12-02 LAB — BASIC METABOLIC PANEL
ANION GAP: 7 (ref 5–15)
BUN: 10 mg/dL (ref 6–20)
CALCIUM: 7.8 mg/dL — AB (ref 8.9–10.3)
CO2: 24 mmol/L (ref 22–32)
CREATININE: 0.62 mg/dL (ref 0.61–1.24)
Chloride: 102 mmol/L (ref 101–111)
GFR calc Af Amer: >60 mL/min (ref >60)
GLUCOSE: 122 mg/dL — AB (ref 65–99)
Potassium: 4.2 mmol/L (ref 3.5–5.1)
Sodium: 133 mmol/L — ABNORMAL LOW (ref 135–145)

## 2015-12-02 MED ORDER — BISACODYL 5 MG PO TBEC
10.0000 mg | DELAYED_RELEASE_TABLET | Freq: Every day | ORAL | Status: DC
Start: 2015-12-02 — End: 2015-12-03
  Administered 2015-12-02: 10 mg via ORAL
  Filled 2015-12-02 (×2): qty 2

## 2015-12-02 NOTE — Progress Notes (Signed)
Physical Therapy Treatment Patient Details Name: Chris Robinson MRN: [id number] DOB: [birth year] Today's Date: 12/02/2015    History of Present Illness 59 y.o. male with medical history significant of CVA and cigarettes smoking. Patient came to the hospital because of right hip pain. Patient reported that he was trying to help his wife who fell on Sunday and because of his balance issues secondary to his previous stroke he fell himself and landed on his right hip he developed severe pain but he was able to crawl back to his bed. He decided to come today to seek medical help, because he couldn't walk on it. X-ray showed mildly comminuted intertrochanteric right hip fracture.  Pt is s/p R IM nail.  WBAT.      PT Comments    Pt received sitting up in the chair, and expressed he was ready to go back to bed.  Pt demonstrated posterior LOB with sit<>stand from the chair, and required Mod A to prevent falling back into the chair.  Pt was able to ambulate 39ft with RW and min guard, however this is limited due to increased pain with pt reportedly refusing to take any pain medication.  Pt transferred sit<>supine with supervision and using gait belt as a leg lifter.  Discussed my concerns about his pain, and how that limits mobility, especially since he will be going home.  Pt expressed that he understands importance of mobility, and he won't be staying in bed.  Encouraged pt to mobilize again when OT comes to see him today.  Continue to recommend HHPT, as well as a w/c for d/c.     Follow Up Recommendations  Home health PT     Equipment Recommendations  Wheelchair (measurements PT)    Recommendations for Other Services       Precautions / Restrictions Precautions Precautions: Fall Precaution Comments: Pt reports 2 falls in the past 6 months.   Restrictions Weight Bearing Restrictions: No RLE Weight Bearing: Weight bearing as tolerated    Mobility  Bed Mobility Overal bed mobility: Needs  Assistance Bed Mobility: Sit to Supine     Supine to sit: HOB elevated;Min assist (increased time with mattress depressed and assisting with maintained dorsiflexion.  Pt was able to control upper body while swiveling around to sit on the EOB. ) Sit to supine: Supervision (Use of gait belt as leg lifter to lift R LE into the bed. )      Transfers Overall transfer level: Needs assistance Equipment used: Rolling walker (2 wheeled) Transfers: Sit to/from Stand Sit to Stand: Min assist;Mod assist (Pt was able to push up from the chair, however demonstrated posterior LOB, which required Mod A from PT to prevent falling back into the chair. ) Stand pivot transfers: Min guard          Ambulation/Gait Ambulation/Gait assistance: Min guard Ambulation Distance (Feet): 20 Feet Assistive device: Rolling walker (2 wheeled) Gait Pattern/deviations: Step-to pattern   Gait velocity interpretation: <1.8 ft/sec, indicative of risk for recurrent falls General Gait Details: Slowed gait speed with heavy weight bearing through UE's. and L LE instead of even weight bearing through B LE's.    Stairs            Wheelchair Mobility    Modified Rankin (Stroke Patients Only)       Balance Overall balance assessment: Needs assistance Sitting-balance support: Bilateral upper extremity supported;Feet supported Sitting balance-Leahy Scale: Good     Standing balance support: Bilateral upper extremity supported Standing balance-Leahy  Scale: Fair                      Cognition Arousal/Alertness: Awake/alert Behavior During Therapy: WFL for tasks assessed/performed Overall Cognitive Status: Within Functional Limits for tasks assessed                      Exercises      General Comments        Pertinent Vitals/Pain Pain Assessment: Faces Faces Pain Scale: Hurts whole lot Pain Location: R hip Pain Intervention(s): Limited activity within patient's  tolerance;Repositioned;Monitored during session (Pt reportedly refusing pain medication)    Home Living   Living Arrangements: Spouse/significant other (pt works for the [employer], wife is a brain CA survivor.  )   Type of Home: House Home Access: Level entry   Home Layout: Laundry or work area in basement;One level (Pt states they have someone to help with that.  ) Home Equipment: Dan HumphreysWalker - 2 wheels;Cane - single point;Bedside commode;Shower seat      Prior Function    Gait / Transfers Assistance Needed: independent - distance unlimited.   ADL's / Homemaking Assistance Needed: pt was independent with dressing, and bathing.  Pt does not drive.  Pt states he was able to run errands and get around the grocery store.       PT Goals (current goals can now be found in the care plan section) Acute Rehab PT Goals Patient Stated Goal: Pt wants to be able to go home.  PT Goal Formulation: With patient Time For Goal Achievement: 12/09/15 Potential to Achieve Goals: Good Progress towards PT goals: Progressing toward goals    Frequency    BID      PT Plan Current plan remains appropriate    Co-evaluation             End of Session Equipment Utilized During Treatment: Gait belt Activity Tolerance: Patient limited by pain Patient left: in chair;with call bell/phone within reach     Time: 1302-1316 PT Time Calculation (min) (ACUTE ONLY): 14 min  Charges:  $Gait Training: 8-22 mins $Therapeutic Activity: 8-22 mins                    G Codes:  Functional Assessment Tool Used: [employer] AM-PAC "6-clicks"  Functional Limitation: Mobility: Walking and moving around Mobility: Walking and Moving Around Current Status [phone]): At least 40 percent but less than 60 percent impaired, limited or restricted Mobility: Walking and Moving Around Goal Status [phone]([id number]): At least 20 percent but less than 40 percent impaired, limited or restricted   Beth Sullivan Blasing, PT, DPT X: [phone]

## 2015-12-02 NOTE — Progress Notes (Signed)
OT Cancellation Note  Patient Details Name: Chris Robinson MRN: [id number] DOB: 1955/04/03   Cancelled Treatment:     Reason evaluation not completed: OT made second attempt to evaluate pt this afternoon after pt refused this am. Pt continues to refuse to participate and mobilize for OT evaluation. OT educated pt on importance of early mobilization and weightbearing to promote healing. OT encouraged pt to sit up in chair for a while this afternoon, mobilize in room, or sit on edge of bed. Pt adamantly refuses to participate stating "I've been up twice and that's it for me." Pt acknowledges OT education on importance of mobilization, however reports he has been through pain like this with a stroke and getting up doesn't help. OT educated on differences in a stroke and hip fracture/surgery. Pt continues to refuse.   Ezra SitesLeslie Troxler, OTR/L  [phone] 12/02/2015, 3:09 PM

## 2015-12-02 NOTE — Clinical Social Work Note (Signed)
CSW followed up with pt after PT evaluation. Recommendation is for home health and pt confirms plan to return home. CSW will sign off, but can be reconsulted if needed.  Derenda FennelKara Amiayah Giebel, LCSW [phone]

## 2015-12-02 NOTE — Evaluation (Signed)
Physical Therapy Evaluation Patient Details Name: Chris Robinson MRN: [id number] DOB: 11/21/1955 Today's Date: 12/02/2015   History of Present Illness  60 y.o. male with medical history significant of CVA and cigarettes smoking. Patient came to the hospital because of right hip pain. Patient reported that he was trying to help his wife who fell on Sunday and because of his balance issues secondary to his previous stroke he fell himself and landed on his right hip he developed severe pain but he was able to crawl back to his bed. He decided to come today to seek medical help, because he couldn't walk on it. X-ray showed mildly comminuted intertrochanteric right hip fracture.  Pt is s/p R IM nail.  WBAT.    Clinical Impression  Pt received in bed, and was marginally agreeable to PT evaluation.  Pt expressed that he was independent with ambulation and ADL's PTA.  Pt expressed that he is having very little pain when he is still, but when he moves, it is excruciating.  Pt initially expressed that he was not going to move today, however after education on importance of mobility, he was willing to attempt.  Pt required Min A for supine<>sit to advance R LE off the EOB.  Min guard for transfer sit<>stand with RW.  Pt was then able to transfer bed<>chair, however declined ambulation due to pain.  Pt expressed that he wants to go home at d/c.  Therefore, PT educated pt on his goals and need to ambulate a household distance prior to discharge.  He is also recommended for HHPT, and a w/c.    Follow Up Recommendations Home health PT    Equipment Recommendations  Wheelchair (measurements PT)    Recommendations for Other Services       Precautions / Restrictions Precautions Precautions: Fall Precaution Comments: Pt reports 2 falls in the past 6 months.   Restrictions Weight Bearing Restrictions: No RLE Weight Bearing: Weight bearing as tolerated      Mobility  Bed Mobility Overal bed mobility: Needs  Assistance Bed Mobility: Supine to Sit     Supine to sit: HOB elevated;Min assist (increased time with mattress depressed and assisting with maintained dorsiflexion.  Pt was able to control upper body while swiveling around to sit on the EOB. )        Transfers Overall transfer level: Needs assistance Equipment used: Rolling walker (2 wheeled) Transfers: Sit to/from [employer] CorporationStand;Stand Pivot Transfers Sit to Stand: Min guard;From elevated surface Stand pivot transfers: Min guard          Ambulation/Gait Ambulation/Gait assistance:  (N/A - due to pt with increased pain. )              Stairs            Wheelchair Mobility    Modified Rankin (Stroke Patients Only)       Balance Overall balance assessment: Needs assistance Sitting-balance support: Bilateral upper extremity supported;Feet supported Sitting balance-Leahy Scale: Good     Standing balance support: Bilateral upper extremity supported Standing balance-Leahy Scale: Fair                               Pertinent Vitals/Pain Pain Assessment:  (Pt states that his pain when still is "fine" but when he moves it's "scream"  )    Home Living   Living Arrangements: Spouse/significant other (pt works for the [employer], wife is a brain CA survivor.  )  Type of Home: House Home Access: Level entry     Home Layout: Laundry or work area in basement;One level (Pt states they have someone to help with that.  ) Home Equipment: Dan Humphreys - 2 wheels;Cane - single point;Bedside commode;Shower seat      Prior Function     Gait / Transfers Assistance Needed: independent - distance unlimited.    ADL's / Homemaking Assistance Needed: pt was independent with dressing, and bathing.  Pt does not drive.  Pt states he was able to run errands and get around the grocery store.          Hand Dominance   Dominant Hand: Right    Extremity/Trunk Assessment   Upper Extremity Assessment: LUE deficits/detail        LUE Deficits / Details: increased pain - pt states he fx'd his shoulder 3 months ago.     Lower Extremity Assessment: RLE deficits/detail         Communication   Communication: No difficulties  Cognition Arousal/Alertness: Awake/alert Behavior During Therapy: WFL for tasks assessed/performed Overall Cognitive Status: Within Functional Limits for tasks assessed                      General Comments      Exercises     Assessment/Plan    PT Assessment Patient needs continued PT services  PT Problem List Decreased strength;Decreased activity tolerance;Decreased balance;Decreased mobility;Decreased knowledge of use of DME;Decreased safety awareness;Decreased knowledge of precautions;Pain          PT Treatment Interventions DME instruction;Gait training;Functional mobility training;Therapeutic activities;Therapeutic exercise;Balance training;Patient/family education    PT Goals (Current goals can be found in the Care Plan section)  Acute Rehab PT Goals Patient Stated Goal: Pt wants to be able to go home.  PT Goal Formulation: With patient Time For Goal Achievement: 12/09/15 Potential to Achieve Goals: Good    Frequency BID   Barriers to discharge        Co-evaluation               End of Session Equipment Utilized During Treatment: Gait belt Activity Tolerance: Patient limited by pain Patient left: in chair;with call bell/phone within reach Nurse Communication: Mobility status Kriste Basque, RN notified.)    Functional Assessment Tool Used: [employer] "6-clicks"  Functional Limitation: Mobility: Walking and moving around Mobility: Walking and Moving Around Current Status [phone]): At least 40 percent but less than 60 percent impaired, limited or restricted Mobility: Walking and Moving Around Goal Status [phone]): At least 20 percent but less than 40 percent impaired, limited or restricted    Time: 0981-1914 PT Time Calculation (min)  (ACUTE ONLY): 34 min   Charges:   PT Evaluation $PT Eval Low Complexity: 1 Procedure PT Treatments $Therapeutic Activity: 8-22 mins   PT G Codes:   PT G-Codes **NOT FOR INPATIENT CLASS** Functional Assessment Tool Used: [employer] "6-clicks"  Functional Limitation: Mobility: Walking and moving around Mobility: Walking and Moving Around Current Status [phone]): At least 40 percent but less than 60 percent impaired, limited or restricted Mobility: Walking and Moving Around Goal Status [phone]): At least 20 percent but less than 40 percent impaired, limited or restricted    Beth Takelia Urieta, PT, DPT X: [phone]

## 2015-12-02 NOTE — Progress Notes (Signed)
Utilization review completed.  L. J. Caio Devera RN, BSN, CM 

## 2015-12-02 NOTE — Progress Notes (Signed)
Utilization re- review completed.  L. J. Levin Dagostino RN, BSN, CM 

## 2015-12-02 NOTE — Progress Notes (Signed)
PROGRESS NOTE                                                                                                                                                                                                             Patient Demographics:    Chris Robinson, is a 60 y.o. male, DOB - [birth year], [id number]  Admit date - 11/29/2015   Admitting Physician Chris Llano, MD  Outpatient Primary MD for the patient is No PCP Per Patient  LOS - 3  Chief Complaint  Patient presents with  . Fall       Brief Narrative     Chris Robinson is a 60 y.o. male with medical history significant of CVA and cigarettes smoking. Patient came to the hospital because of right hip pain. Patient reported that he was trying to help his wife who fell on Sunday and because of his balance issues secondary to his previous stroke he fell himself and landed on his right hip he developed severe pain but he was able to crawl back to his bed. He decided to come today to seek medical help, because he couldn't walk on it. X-ray showed mildly comminuted intertrochanteric right hip fracture   Subjective:    Chris Robinson today has, No headache, No chest pain, No abdominal pain - No Nausea, No new weakness tingling or numbness, No Cough - SOB. Mildly agitated.   Assessment  & Plan :     1.Mechanical fall with right intertrochanteric hip fracture. Orthopedics on board, post ORIF on 12/01/2015,Mild perioperative blood loss related anemia not requiring transfusion with stable H&H, weightbearing as tolerated, aspirin 325 once a day for 4 weeks postop per orthopedics. Start PT may require placement.   2.Incidental finding of ST depression on EKG done in the ER, chest pain-free, repeat EKG improved, stable baseline echocardiogram with preserved EF of 65% and no wall motion abnormality. He does smoke but has no history of CAD, good mets, can climb 2 flights of stairs  without any chest pain, fairly active at home. Follow with PCP outpatient for secondary risk factor stratification and monitoring.  3. Smoking. Counseled to quit.  4. History of stroke. On aspirin continue.  5. Hypokalemia and hypomagnesemia. Both replaced & stable.  6. Mild Delirium - able with Haldol when necessary and much improved this morning.  Family Communication  :  none  Code Status : Full  Diet : Heart Healthy  Disposition Plan  :  Would be decided after PT evaluation  Consults  :  Ortho  Procedures  :    TTE - The cavity size was normal. Systolic function was vigorous. The estimated ejection fraction was in the range of 65% to 70%. Wall motion was normal; there were no regional wall motion abnormalities. Very mild concentric left ventricular hypertrophy. There was a borderline abnormality in the ratio of early to atrial left ventricular filling. The pulmonary vein flow pattern was normal.  ORIF right hip on 12/01/2015 by Dr. Romeo Robinson   DVT Prophylaxis  :  Heparin    Lab Results  Component Value Date   PLT 302 12/02/2015    Inpatient Medications  Scheduled Meds: . aspirin  325 mg Oral Daily  . feeding supplement (ENSURE ENLIVE)  237 mL Oral Q24H  . polyethylene glycol  17 g Oral Daily   Continuous Infusions:   PRN Meds:.acetaminophen **OR** acetaminophen, alum & mag hydroxide-simeth, haloperidol lactate, HYDROcodone-acetaminophen, menthol-cetylpyridinium **OR** phenol, metoCLOPramide **OR** metoCLOPramide (REGLAN) injection, metoprolol, morphine injection, ondansetron **OR** ondansetron (ZOFRAN) IV  Antibiotics  :    Anti-infectives    Start     Dose/Rate Route Frequency Ordered Stop   12/01/15 1630  ceFAZolin (ANCEF) IVPB 2g/100 mL premix  Status:  Discontinued     2 g 200 mL/hr over 30 Minutes Intravenous Every 6 hours 12/01/15 1625 12/01/15 1626   12/01/15 1615  ceFAZolin (ANCEF) IVPB 2g/100 mL premix     2 g 200 mL/hr over 30 Minutes Intravenous  Every 6 hours 12/01/15 1608 12/01/15 2337   12/01/15 0216  ceFAZolin (ANCEF) IVPB 2g/100 mL premix     2 g 200 mL/hr over 30 Minutes Intravenous On call to O.R. 12/01/15 0216 12/01/15 1352         Objective:   Vitals:   12/01/15 1800 12/01/15 1900 12/01/15 2036 12/02/15 0600  BP: 133/74 125/70  (!) 143/89  Pulse: 72 90  100  Resp: 17 16  18   Temp: 98 F (36.7 C) 97.7 F (36.5 C)  98.6 F (37 C)  TempSrc: Axillary Axillary  Oral  SpO2: 96% 98% 96% 97%  Weight:      Height:        Wt Readings from Last 3 Encounters:  12/01/15 75.8 kg (167 lb)     Intake/Output Summary (Last 24 hours) at 12/02/15 0859 Last data filed at 12/02/15 0700  Gross per 24 hour  Intake             1220 ml  Output             1050 ml  Net              170 ml     Physical Exam  Awake Alert, Oriented X 3, No new F.N deficits, Normal affect Chris Robinson.AT,PERRAL Supple Neck,No JVD, No cervical lymphadenopathy appriciated.  Symmetrical Chest wall movement, Good air movement bilaterally, CTAB RRR,No Gallops,Rubs or new Murmurs, No Parasternal Heave +ve B.Sounds, Abd Soft, No tenderness, No organomegaly appriciated, No rebound - guarding or rigidity. No Cyanosis, Clubbing or edema, No new Rash or bruise , L LEG EXT ROTATED    Data Review:    CBC  Recent Labs Lab 11/29/15 1152 11/30/15 0535 12/01/15 0536 12/02/15 0538  WBC 10.5 8.3 9.5 10.2  HGB 12.5* 11.1* 11.2* 10.2*  HCT 35.3* 32.1* 32.9* 30.0*  PLT 257 233 298 302  MCV 118.9* 119.8* 121.0* 119.5*  MCH 42.1* 41.4* 41.2* 40.6*  MCHC 35.4 34.6 34.0 34.0  RDW 14.6 13.1 14.3 14.3  LYMPHSABS 0.8  --   --   --   MONOABS 1.4*  --   --   --   EOSABS 0.0  --   --   --   BASOSABS 0.0  --   --   --     Chemistries   Recent Labs Lab 11/29/15 1152 11/30/15 0535 12/01/15 0536 12/02/15 0538  NA 134* 134* 134* 133*  K 2.9* 3.2* 3.7 4.2  CL 94* 97* 99* 102  CO2 28 28 25 24   GLUCOSE 112* 115* 108* 122*  BUN 14 14 12 10   CREATININE 0.67  0.59* 0.52* 0.62  CALCIUM 8.6* 8.0* 8.2* 7.8*  MG  --  1.4* 1.7  --   AST 31  --   --   --   ALT 27  --   --   --   ALKPHOS 60  --   --   --   BILITOT 2.0*  --   --   --    ------------------------------------------------------------------------------------------------------------------ No results for input(s): CHOL, HDL, LDLCALC, TRIG, CHOLHDL, LDLDIRECT in the last 72 hours.  No results found for: HGBA1C ------------------------------------------------------------------------------------------------------------------ No results for input(s): TSH, T4TOTAL, T3FREE, THYROIDAB in the last 72 hours.  Invalid input(s): FREET3 ------------------------------------------------------------------------------------------------------------------ No results for input(s): VITAMINB12, FOLATE, FERRITIN, TIBC, IRON, RETICCTPCT in the last 72 hours.  Coagulation profile  Recent Labs Lab 11/29/15 1152  INR 0.93    No results for input(s): DDIMER in the last 72 hours.  Cardiac Enzymes No results for input(s): CKMB, TROPONINI, MYOGLOBIN in the last 168 hours.  Invalid input(s): CK ------------------------------------------------------------------------------------------------------------------ No results found for: BNP  Micro Results Recent Results (from the past 240 hour(s))  Surgical PCR screen     Status: None   Collection Time: 11/30/15  5:27 AM  Result Value Ref Range Status   MRSA, PCR NEGATIVE NEGATIVE Final   Staphylococcus aureus NEGATIVE NEGATIVE Final    Comment:        The Xpert SA Assay (FDA approved for NASAL specimens in patients over 10 years of age), is one component of a comprehensive surveillance program.  Test performance has been validated by [facility] for patients greater than or equal to 53 year old. It is not intended to diagnose infection nor to guide or monitor treatment.     Radiology Reports Dg Chest 1 View  Result Date: 11/29/2015 CLINICAL  DATA:  60 year old male status post falls. Hip fracture. Preoperative study. Initial encounter. EXAM: CHEST 1 VIEW COMPARISON:  Chest radiographs 06/24/2003 FINDINGS: AP supine view at 1216 hours.Cardiac size at the upper limits of normal to mildly enlarged. Other mediastinal contours are within normal limits. Visualized tracheal air column is within normal limits. Allowing for technique the lungs are clear. No pneumothorax or effusion. IMPRESSION: No acute cardiopulmonary abnormality. Borderline to mild cardiomegaly. Electronically Signed   By: Odessa Fleming M.D.   On: 11/29/2015 12:50   Dg Shoulder Left  Result Date: 11/29/2015 CLINICAL DATA:  Acute left shoulder pain following fall. Initial encounter. EXAM: LEFT SHOULDER - 2+ VIEW COMPARISON:  None. FINDINGS: A mildly comminuted fracture of the lateral posterior humeral head/ greater tuberosity identified. There is no evidence of subluxation or dislocation. No suspicious focal bony lesions are noted. No other fractures identified. IMPRESSION: Mildly comminuted lateral posterior humeral head/ greater tuberosity fracture.  Electronically Signed   By: Harmon Pier M.D.   On: 11/29/2015 12:52   Dg Hip Operative Unilat W Or W/o Pelvis Right  Result Date: 12/01/2015 CLINICAL DATA:  ORIF right hip EXAM: OPERATIVE RIGHT HIP (WITH PELVIS IF PERFORMED) 7 VIEWS TECHNIQUE: Fluoroscopic spot image(s) were submitted for interpretation post-operatively. COMPARISON:  11/29/2015. FINDINGS: ORIF right hip fracture. Hardware intact. Good anatomic alignment. Seven images obtained. 1 minutes 44 seconds fluoroscopy time . IMPRESSION: ORIF right hip fracture.  Hardware intact.  Anatomic alignment. Electronically Signed   By: Maisie Fus  Register   On: 12/01/2015 15:51   Dg Hip Unilat W Or Wo Pelvis 2-3 Views Right  Result Date: 11/29/2015 CLINICAL DATA:  Acute right hip pain following fall 2 days ago. Initial encounter. EXAM: DG HIP (WITH OR WITHOUT PELVIS) 2-3V RIGHT COMPARISON:   None. FINDINGS: A mildly comminuted intertrochanteric fracture the proximal right femur noted without significant angulation identified. There is no evidence of subluxation or dislocation. Mild degenerative changes in the hips are present bilaterally. No suspicious focal bony lesions are noted. IMPRESSION: Mildly comminuted right intertrochanteric femur fracture. Electronically Signed   By: Harmon Pier M.D.   On: 11/29/2015 12:50    Time Spent in minutes  30   SINGH,PRASHANT K M.D on 12/02/2015 at 8:59 AM  Between 7am to 7pm - Pager - [phone]  After 7pm go to [email or web address] - password [facility]  [facility] -  Office  [phone]                                                                       PROGRESS NOTE                                                                                                                                                                                                             Patient Demographics:    Chris Robinson, is a 60 y.o. male, DOB - 1955/07/02, GNF:[id number]  Admit date - 11/29/2015   Admitting Physician Chris Llano, MD  Outpatient Primary MD for the patient is No PCP Per Patient  LOS - 3  Chief Complaint  Patient presents with  . Fall       Brief Narrative     Chris Robinson is a 60 y.o. male with  medical history significant of CVA and cigarettes smoking. Patient came to the hospital because of right hip pain. Patient reported that he was trying to help his wife who fell on Sunday and because of his balance issues secondary to his previous stroke he fell himself and landed on his right hip he developed severe pain but he was able to crawl back to his bed. He decided to come today to seek medical help, because he couldn't walk on it. X-ray showed mildly comminuted intertrochanteric right hip fracture   Subjective:    Chris Robinson today has, No headache, No chest pain, No abdominal pain - No Nausea, No new weakness tingling or  numbness, No Cough - SOB. Mildly agitated.   Assessment  & Plan :     1.Mechanical fall with right intertrochanteric hip fracture. Orthopedics on board, plan to operate 12/01/2015, Cardio-Pulm Risk stratification for surgery and recommendations to minimize the same:-  A.Cardio-Pulmonary Risk -  this patient is a low to moderate risk  for adverse Cardio-Pulmonary  Outcome  from surgery, the risks and benefits were discussed and acceptable to the patient  Recommendations for optimizing Cardio-Pulmonary  Risk risk factors  1. Keep SBP<140, HR<85, use Lopressor 5mg  IV q4hrs PRN, or B.Blocker drip PRN. 2. Moniotr I&Os. 3. Minimal sedation and Narcotics. 4. Good pulmunary toilet. 5. PRN Nebs and as needed oxygen to keep Pox>90% 6. Hb>8, transfuse as needed - Lasix 10mg  IV after each unit PRBC Transfused.   B.Bleeding Risk - no previous surgical complications, no easy bruising,  Antiplate meds ASA.   Lab Results  Component Value Date   PLT 302 12/02/2015                  Lab Results  Component Value Date   INR 0.93 11/29/2015   Will request Surgeon to please Order DVT prophylaxis of his/her choice, along with activity, weight bearing precautions and diet if appropriate.     2.Incidental finding of ST depression on EKG done in the ER, chest pain-free, repeat EKG improved, stable baseline echocardiogram with preserved EF of 65% and no wall motion abnormality. He does smoke but has no history of CAD, good mets, can climb 2 flights of stairs without any chest pain, fairly active at home.  3. Smoking. Counseled to quit.  4. History of stroke. On aspirin continue.  5. Hypokalemia and hypomagnesemia. Both replaced & stable.  6. Mild Delirium - Haldol PRN    Family Communication  :  none  Code Status : Full  Diet : Heart Healthy  Disposition Plan  :  Stay inpt  Consults  :  Ortho  Procedures  :    TTE - The cavity size was normal. Systolic function was vigorous. The  estimated ejection fraction was in the range of 65% to 70%. Wall motion was normal; there were no regional wall motion abnormalities. Very mild concentric left ventricular hypertrophy. There was a borderline abnormality in the ratio of early to atrial left ventricular filling. The pulmonary vein flow pattern was normal.  DVT Prophylaxis  :  Heparin    Lab Results  Component Value Date   PLT 302 12/02/2015    Inpatient Medications  Scheduled Meds: . aspirin  325 mg Oral Daily  . bisacodyl  10 mg Oral Daily  . feeding supplement (ENSURE ENLIVE)  237 mL Oral Q24H  . polyethylene glycol  17 g Oral Daily   Continuous Infusions:   PRN Meds:.acetaminophen **OR** acetaminophen, alum & mag  hydroxide-simeth, haloperidol lactate, HYDROcodone-acetaminophen, menthol-cetylpyridinium **OR** phenol, metoCLOPramide **OR** metoCLOPramide (REGLAN) injection, metoprolol, morphine injection, ondansetron **OR** ondansetron (ZOFRAN) IV  Antibiotics  :    Anti-infectives    Start     Dose/Rate Route Frequency Ordered Stop   12/01/15 1630  ceFAZolin (ANCEF) IVPB 2g/100 mL premix  Status:  Discontinued     2 g 200 mL/hr over 30 Minutes Intravenous Every 6 hours 12/01/15 1625 12/01/15 1626   12/01/15 1615  ceFAZolin (ANCEF) IVPB 2g/100 mL premix     2 g 200 mL/hr over 30 Minutes Intravenous Every 6 hours 12/01/15 1608 12/01/15 2337   12/01/15 0216  ceFAZolin (ANCEF) IVPB 2g/100 mL premix     2 g 200 mL/hr over 30 Minutes Intravenous On call to O.R. 12/01/15 0216 12/01/15 1352         Objective:   Vitals:   12/01/15 1800 12/01/15 1900 12/01/15 2036 12/02/15 0600  BP: 133/74 125/70  (!) 143/89  Pulse: 72 90  100  Resp: 17 16  18   Temp: 98 F (36.7 C) 97.7 F (36.5 C)  98.6 F (37 C)  TempSrc: Axillary Axillary  Oral  SpO2: 96% 98% 96% 97%  Weight:      Height:        Wt Readings from Last 3 Encounters:  12/01/15 75.8 kg (167 lb)     Intake/Output Summary (Last 24 hours) at 12/02/15  0859 Last data filed at 12/02/15 0700  Gross per 24 hour  Intake             1220 ml  Output             1050 ml  Net              170 ml     Physical Exam  Awake Alert, Oriented X 3, No new F.N deficits, Normal affect [place].AT,PERRAL Supple Neck,No JVD, No cervical lymphadenopathy appriciated.  Symmetrical Chest wall movement, Good air movement bilaterally, CTAB RRR,No Gallops,Rubs or new Murmurs, No Parasternal Heave +ve B.Sounds, Abd Soft, No tenderness, No organomegaly appriciated, No rebound - guarding or rigidity. No Cyanosis, Clubbing or edema, No new Rash or bruise , L LEG EXT ROTATED    Data Review:    CBC  Recent Labs Lab 11/29/15 1152 11/30/15 0535 12/01/15 0536 12/02/15 0538  WBC 10.5 8.3 9.5 10.2  HGB 12.5* 11.1* 11.2* 10.2*  HCT 35.3* 32.1* 32.9* 30.0*  PLT 257 233 298 302  MCV 118.9* 119.8* 121.0* 119.5*  MCH 42.1* 41.4* 41.2* 40.6*  MCHC 35.4 34.6 34.0 34.0  RDW 14.6 13.1 14.3 14.3  LYMPHSABS 0.8  --   --   --   MONOABS 1.4*  --   --   --   EOSABS 0.0  --   --   --   BASOSABS 0.0  --   --   --     Chemistries   Recent Labs Lab 11/29/15 1152 11/30/15 0535 12/01/15 0536 12/02/15 0538  NA 134* 134* 134* 133*  K 2.9* 3.2* 3.7 4.2  CL 94* 97* 99* 102  CO2 28 28 25 24   GLUCOSE 112* 115* 108* 122*  BUN 14 14 12 10   CREATININE 0.67 0.59* 0.52* 0.62  CALCIUM 8.6* 8.0* 8.2* 7.8*  MG  --  1.4* 1.7  --   AST 31  --   --   --   ALT 27  --   --   --   ALKPHOS 60  --   --   --  BILITOT 2.0*  --   --   --    ------------------------------------------------------------------------------------------------------------------ No results for input(s): CHOL, HDL, LDLCALC, TRIG, CHOLHDL, LDLDIRECT in the last 72 hours.  No results found for: HGBA1C ------------------------------------------------------------------------------------------------------------------ No results for input(s): TSH, T4TOTAL, T3FREE, THYROIDAB in the last 72 hours.  Invalid  input(s): FREET3 ------------------------------------------------------------------------------------------------------------------ No results for input(s): VITAMINB12, FOLATE, FERRITIN, TIBC, IRON, RETICCTPCT in the last 72 hours.  Coagulation profile  Recent Labs Lab 11/29/15 1152  INR 0.93    No results for input(s): DDIMER in the last 72 hours.  Cardiac Enzymes No results for input(s): CKMB, TROPONINI, MYOGLOBIN in the last 168 hours.  Invalid input(s): CK ------------------------------------------------------------------------------------------------------------------ No results found for: BNP  Micro Results Recent Results (from the past 240 hour(s))  Surgical PCR screen     Status: None   Collection Time: 11/30/15  5:27 AM  Result Value Ref Range Status   MRSA, PCR NEGATIVE NEGATIVE Final   Staphylococcus aureus NEGATIVE NEGATIVE Final    Comment:        The Xpert SA Assay (FDA approved for NASAL specimens in patients over 68 years of age), is one component of a comprehensive surveillance program.  Test performance has been validated by [facility] for patients greater than or equal to 20 year old. It is not intended to diagnose infection nor to guide or monitor treatment.     Radiology Reports Dg Chest 1 View  Result Date: 11/29/2015 CLINICAL DATA:  60 year old male status post falls. Hip fracture. Preoperative study. Initial encounter. EXAM: CHEST 1 VIEW COMPARISON:  Chest radiographs 06/24/2003 FINDINGS: AP supine view at 1216 hours.Cardiac size at the upper limits of normal to mildly enlarged. Other mediastinal contours are within normal limits. Visualized tracheal air column is within normal limits. Allowing for technique the lungs are clear. No pneumothorax or effusion. IMPRESSION: No acute cardiopulmonary abnormality. Borderline to mild cardiomegaly. Electronically Signed   By: Odessa Fleming M.D.   On: 11/29/2015 12:50   Dg Shoulder Left  Result Date:  11/29/2015 CLINICAL DATA:  Acute left shoulder pain following fall. Initial encounter. EXAM: LEFT SHOULDER - 2+ VIEW COMPARISON:  None. FINDINGS: A mildly comminuted fracture of the lateral posterior humeral head/ greater tuberosity identified. There is no evidence of subluxation or dislocation. No suspicious focal bony lesions are noted. No other fractures identified. IMPRESSION: Mildly comminuted lateral posterior humeral head/ greater tuberosity fracture. Electronically Signed   By: Harmon Pier M.D.   On: 11/29/2015 12:52   Dg Hip Operative Unilat W Or W/o Pelvis Right  Result Date: 12/01/2015 CLINICAL DATA:  ORIF right hip EXAM: OPERATIVE RIGHT HIP (WITH PELVIS IF PERFORMED) 7 VIEWS TECHNIQUE: Fluoroscopic spot image(s) were submitted for interpretation post-operatively. COMPARISON:  11/29/2015. FINDINGS: ORIF right hip fracture. Hardware intact. Good anatomic alignment. Seven images obtained. 1 minutes 44 seconds fluoroscopy time . IMPRESSION: ORIF right hip fracture.  Hardware intact.  Anatomic alignment. Electronically Signed   By: Maisie Fus  Register   On: 12/01/2015 15:51   Dg Hip Unilat W Or Wo Pelvis 2-3 Views Right  Result Date: 11/29/2015 CLINICAL DATA:  Acute right hip pain following fall 2 days ago. Initial encounter. EXAM: DG HIP (WITH OR WITHOUT PELVIS) 2-3V RIGHT COMPARISON:  None. FINDINGS: A mildly comminuted intertrochanteric fracture the proximal right femur noted without significant angulation identified. There is no evidence of subluxation or dislocation. Mild degenerative changes in the hips are present bilaterally. No suspicious focal bony lesions are noted. IMPRESSION: Mildly comminuted right intertrochanteric femur fracture.  Electronically Signed   By: Harmon Pier M.D.   On: 11/29/2015 12:50    Time Spent in minutes  30   SINGH,PRASHANT K M.D on 12/02/2015 at 8:59 AM  Between 7am to 7pm - Pager - [phone]  After 7pm go to [email or web address] - password [facility]  [facility] -  Office  [phone]

## 2015-12-02 NOTE — Progress Notes (Signed)
Patient ID: Chris Robinson, male   DOB: [birth year], 60 y.o.   MRN: [id number] Postop day 1 status post right hip surgery for intertrochanteric fracture with intramedullary nail  BP (!) 143/89 (BP Location: Left Arm)   Pulse 100   Temp 98.6 F (37 C) (Oral)   Resp 18   Ht 6' (1.829 m)   Wt 167 lb (75.8 kg)   SpO2 97%   BMI 22.65 kg/m   CBC Latest Ref Rng & Units 12/02/2015 12/01/2015 11/30/2015  WBC 4.0 - 10.5 K/uL 10.2 9.5 8.3  Hemoglobin 13.0 - 17.0 g/dL 10.2(L) 11.2(L) 11.1(L)  Hematocrit 39.0 - 52.0 % 30.0(L) 32.9(L) 32.1(L)  Platelets 150 - 400 K/uL 302 298 233   BMP Latest Ref Rng & Units 12/02/2015 12/01/2015 11/30/2015  Glucose 65 - 99 mg/dL 409(W122(H) 119(J108(H) 478(G115(H)  BUN 6 - 20 mg/dL 10 12 14   Creatinine 0.61 - 1.24 mg/dL 9.560.62 2.13(Y0.52(L) 8.65(H0.59(L)  Sodium 135 - 145 mmol/L 133(L) 134(L) 134(L)  Potassium 3.5 - 5.1 mmol/L 4.2 3.7 3.2(L)  Chloride 101 - 111 mmol/L 102 99(L) 97(L)  CO2 22 - 32 mmol/L 24 25 28   Calcium 8.9 - 10.3 mg/dL 7.8(L) 8.2(L) 8.0(L)    Dry dressing no peripheral swelling. The patient is awake and alert. Comfortable with pain control.  Start physical therapy today  Weightbearing as tolerated

## 2015-12-02 NOTE — Addendum Note (Signed)
Addendum  created 12/02/15 [id number] by Despina Hiddenobert J Javaeh Muscatello, CRNA   Sign clinical note

## 2015-12-02 NOTE — Anesthesia Postprocedure Evaluation (Signed)
Anesthesia Post Note  Patient: Chris Robinson  Procedure(s) Performed: Procedure(s) (LRB): INTRAMEDULLARY (IM) NAIL INTERTROCHANTRIC (Right)  Patient location during evaluation: Nursing Unit Anesthesia Type: General Level of consciousness: awake and alert, oriented and patient cooperative Pain management: pain level controlled Vital Signs Assessment: post-procedure vital signs reviewed and stable Respiratory status: spontaneous breathing, nonlabored ventilation and respiratory function stable Cardiovascular status: blood pressure returned to baseline and stable Postop Assessment: no signs of nausea or vomiting    Last Vitals:  Vitals:   12/01/15 1900 12/02/15 0600  BP: 125/70 (!) 143/89  Pulse: 90 100  Resp: 16 18  Temp: 36.5 C 37 C    Last Pain:  Vitals:   12/02/15 0600  TempSrc: Oral  PainSc:                  Leeandra Ellerson J

## 2015-12-02 NOTE — Care Management Note (Signed)
Case Management Note  Patient Details  Name: Tyler DeisCraig S Sine MRN: [id number] Date of Birth: [birth year]   Expected Discharge Date:    12/03/2015              Expected Discharge Plan:  Home w Home Health Services  In-House Referral:  NA  Discharge planning Services  CM Consult  Post Acute Care Choice:  Home Health Choice offered to:  Patient  DME Arranged:  Wheelchair manual DME Agency:  [facility]  HH Arranged[facility]  [facility]  Status of Service:  In process, will continue to follow   Additional Comments: Pt discharging home tomorrow with HH PT and WC. Pt has chosen [facility] from list of DME proviers. They will be able to provide pt with financial assistance with cost of WC and PT. Pt is aware that [facility]hrs to initiate services. Alroy BailiffLinda Lothian, of [facility] will obtain pt info and deliver DME to pt room prior to DC.   Malcolm Metrohildress, Kevyn Boquet Demske, RN 12/02/2015, 12:56 PM

## 2015-12-03 MED ORDER — ASPIRIN 325 MG PO TABS: 325.0000 mg | ORAL_TABLET | Freq: Every day | ORAL | 0 refills | Status: DC

## 2015-12-03 MED ORDER — HYDROCODONE-ACETAMINOPHEN 5-325 MG PO TABS: 1.0000 | ORAL_TABLET | Freq: Four times a day (QID) | ORAL | 0 refills | Status: DC | PRN

## 2015-12-03 NOTE — Progress Notes (Signed)
CM able to send information to [facility] to start home health services.

## 2015-12-03 NOTE — Progress Notes (Signed)
Physical Therapy Treatment Patient Details Name: Chris Robinson MRN: [id number] DOB: [birth year] Today's Date: 12/03/2015    History of Present Illness 60 y.o. male with medical history significant of CVA and cigarettes smoking. Patient came to the hospital because of right hip pain. Patient reported that he was trying to help his wife who fell on Sunday and because of his balance issues secondary to his previous stroke he fell himself and landed on his right hip he developed severe pain but he was able to crawl back to his bed. He decided to come today to seek medical help, because he couldn't walk on it. X-ray showed mildly comminuted intertrochanteric right hip fracture.  Pt is s/p R IM nail.  WBAT.      PT Comments    Pt received sitting up on the EOB, just finished going to the bathroom with the nurse aide.  Pt is initially not willing to participate in PT tx.  However, after continued education on importance of mobility and endurance, he is marginally willing to participate in gait training.  Pt demonstrated Mod (I) with sit<>stand transfer, and ambulated 113f with RW and Mod (I).  He was also able to perform sit<>supine with Mod(I) using gait belt as leg lifter for R LE.  Pt continues to be limited by pain as well as poor attitude towards participating with therapy.  Encouraged pt to mobilize frequently at home, to which pt states "I know, I will.  It's just like when I had the stroke."  Pt has met all PT goals at this point, but continue to recommend HHPT, & w/c for d/c home.     Follow Up Recommendations  Home health PT     Equipment Recommendations  Wheelchair (measurements PT) (present in pt's room)    Recommendations for Other Services       Precautions / Restrictions Precautions Precautions: Fall Precaution Comments: Pt reports 2 falls in the past 6 months.   Restrictions Weight Bearing Restrictions: No RLE Weight Bearing: Weight bearing as tolerated    Mobility  Bed  Mobility Overal bed mobility: Modified Independent Bed Mobility: Sit to Supine       Sit to supine: Modified independent (Device/Increase time) (Use of gait belt as leg lifter)      Transfers Overall transfer level: Modified independent Equipment used: Rolling walker (2 wheeled) Transfers: Sit to/from Stand Sit to Stand: Modified independent (Device/Increase time) (No LOB today)            Ambulation/Gait Ambulation/Gait assistance: Modified independent (Device/Increase time) Ambulation Distance (Feet): 150 Feet Assistive device: Rolling walker (2 wheeled) Gait Pattern/deviations: Step-through pattern     General Gait Details: Pt continues to have increased weight bearing through UE's.  However, no LOB.   Stairs            Wheelchair Mobility    Modified Rankin (Stroke Patients Only)       Balance Overall balance assessment: Modified Independent Sitting-balance support: No upper extremity supported Sitting balance-Leahy Scale: Normal     Standing balance support: Bilateral upper extremity supported Standing balance-Leahy Scale: Fair                      Cognition Arousal/Alertness: Awake/alert Behavior During Therapy:  (Argumentative) Overall Cognitive Status: Within Functional Limits for tasks assessed                      Exercises      General Comments  Pertinent Vitals/Pain Pain Assessment:  (Pt does not rate.  RN states he has not had anythign for pain)    Home Living                      Prior Function            PT Goals (current goals can now be found in the care plan section) Acute Rehab PT Goals Patient Stated Goal: Pt wants to be able to go home.  PT Goal Formulation: With patient Time For Goal Achievement: 12/09/15 Potential to Achieve Goals: Good Progress towards PT goals: Progressing toward goals    Frequency    7X/week      PT Plan Frequency needs to be updated     Co-evaluation             End of Session Equipment Utilized During Treatment: Gait belt Activity Tolerance: Other (comment);Patient tolerated treatment well (further activity is limited due to pt's willingness to particpate and work with PT) Patient left: in bed;with call bell/phone within reach     Time: 0855-0906 PT Time Calculation (min) (ACUTE ONLY): 11 min  Charges:  $Gait Training: 8-22 mins                    G Codes:      Beth Tayler Heiden, PT, DPT X: [phone]

## 2015-12-03 NOTE — Discharge Summary (Signed)
Chris Robinson ZOX:[id number] DOB: Feb 27, 1955 DOA: 11/29/2015  PCP: No PCP Per Patient  Admit date: 11/29/2015  Discharge date: 12/03/2015  Admitted From: Home   Disposition:  Home   Recommendations for Outpatient Follow-up:   Follow up with PCP in 1-2 weeks  PCP Please obtain BMP/CBC, 2 view CXR in 1week,  (see Discharge instructions)   PCP Please follow up on the following pending results: None   Home Health: HHPT-Aide   Equipment/Devices: Wheelchair  Consultations: Ortho Discharge Condition: Stable   CODE STATUS: Full   Diet Recommendation: Heart Healthy    Chief Complaint  Patient presents with  . Fall     Brief history of present illness from the day of admission and additional interim summary    Chris Robinson a 60 y.o.malewith medical history significant of CVA and cigarettes smoking. Patient came to the hospital because of right hip pain. Patient reported that he was trying to help his wife who fell on Sunday and because of his balance issues secondary to his previous stroke he fell himself and landed on his right hip he developed severe pain but he was able to crawl back to his bed. He decided to come today to seek medical help, because he couldn't walk on it. X-ray showed mildly comminuted intertrochanteric right hip fracture  Hospital issues addressed     1. Mechanical fall with right intertrochanteric hip fracture. Orthopedics on board, post ORIF on 12/01/2015,Mild perioperative blood loss related anemia not requiring transfusion with stable H&H, weightbearing as tolerated, aspirin 325 once a day for 4 weeks postop per orthopedics. [facility], he refused placement, home health PT, wheelchair ordered, will be discharged today, he feels fine and is relatively symptom-free. We'll follow with PCP  and orthopedics post discharge.   2.Incidental finding of ST depression on EKG done in the ER, chest pain-free, repeat EKG improved, stable baseline echocardiogram with preserved EF of 65% and no wall motion abnormality. He does smoke but has no history of CAD, good mets, can climb 2 flights of stairs without any chest pain, fairly active at home. Follow with PCP outpatient for secondary risk factor stratification and monitoring.  3. Smoking. Counseled to quit.  4. History of stroke. On aspirin continue.  5. Hypokalemia and hypomagnesemia. Both replaced & stable.  6. Mild Delirium - resolved after 1 dose of Haldol day before yesterday.     Discharge diagnosis     Principal Problem:   Closed displaced intertrochanteric fracture of right femur (HCC) Active Problems:   Hypokalemia   CVA (cerebral vascular accident) (HCC)   Malnutrition of moderate degree    Discharge instructions    Discharge Instructions    Diet - low sodium heart healthy    Complete by:  As directed    Discharge instructions    Complete by:  As directed    Follow with Primary MD in 7 days   Get CBC, CMP, 2 view Chest X ray checked  by Primary MD or SNF MD in 5-7 days (  we routinely change or add medications that can affect your baseline labs and fluid status, therefore we recommend that you get the mentioned basic workup next visit with your PCP, your PCP may decide not to get them or add new tests based on their clinical decision)   Activity: weight bearing as tolerated with Full fall precautions use walker/wheelchair & assistance as needed   Disposition Home     Diet:   Heart Healthy   For Heart failure patients - Check your Weight same time everyday, if you gain over 2 pounds, or you develop in leg swelling, experience more shortness of breath or chest pain, call your Primary MD immediately. Follow Cardiac Low Salt Diet and 1.5 lit/day fluid restriction.   On your next visit with your primary  care physician please Get Medicines reviewed and adjusted.   Please request your Prim.MD to go over all Hospital Tests and Procedure/Radiological results at the follow up, please get all Hospital records sent to your Prim MD by signing hospital release before you go home.   If you experience worsening of your admission symptoms, develop shortness of breath, life threatening emergency, suicidal or homicidal thoughts you must seek medical attention immediately by calling 911 or calling your MD immediately  if symptoms less severe.  You Must read complete instructions/literature along with all the possible adverse reactions/side effects for all the Medicines you take and that have been prescribed to you. Take any new Medicines after you have completely understood and accpet all the possible adverse reactions/side effects.   Do not drive, operate heavy machinery, perform activities at heights, swimming or participation in water activities or provide baby sitting services if your were admitted for syncope or siezures until you have seen by Primary MD or a Neurologist and advised to do so again.  Do not drive when taking Pain medications.    Do not take more than prescribed Pain, Sleep and Anxiety Medications  Special Instructions: If you have smoked or chewed Tobacco  in the last 2 yrs please stop smoking, stop any regular Alcohol  and or any Recreational drug use.  Wear Seat belts while driving.   Please note  You were cared for by a hospitalist during your hospital stay. If you have any questions about your discharge medications or the care you received while you were in the hospital after you are discharged, you can call the unit and asked to speak with the hospitalist on call if the hospitalist that took care of you is not available. Once you are discharged, your primary care physician will handle any further medical issues. Please note that NO REFILLS for any discharge medications will be  authorized once you are discharged, as it is imperative that you return to your primary care physician (or establish a relationship with a primary care physician if you do not have one) for your aftercare needs so that they can reassess your need for medications and monitor your lab values.   Increase activity slowly    Complete by:  As directed       Discharge Medications     Medication List    STOP taking these medications   acetaminophen 500 MG tablet Commonly known as:  TYLENOL     TAKE these medications   aspirin 325 MG tablet Take 1 tablet (325 mg total) by mouth daily.   HYDROcodone-acetaminophen 5-325 MG tablet Commonly known as:  NORCO/VICODIN Take 1 tablet by mouth every 6 (six) hours as needed for  moderate pain.       Follow-up Information    Fuller Canada, MD. Schedule an appointment as soon as possible for a visit in 1 week(s).   Specialties:  Orthopedic Surgery, Radiology Contact information: [address] [place] [address] [phone]        [facility]. Schedule an appointment as soon as possible for a visit in 1 week(s).   Contact information: [address] [place] [address] [phone]          Major procedures and Radiology Reports - PLEASE review detailed and final reports thoroughly  -     TTE - The cavity size was normal. Systolic function was vigorous. The estimated ejection fraction was in the range of 65% to 70%. Wall motion was normal; there were no regional wall motion abnormalities. Very mild concentric left ventricular hypertrophy. There was a borderline abnormality in the ratio of early to atrial left ventricular filling. The pulmonary vein flow pattern was normal.  ORIF right hip on 12/01/2015 by Dr. Romeo Apple    Dg Chest 1 View  Result Date: 11/29/2015 CLINICAL DATA:  60 year old male status post falls. Hip fracture. Preoperative study. Initial encounter. EXAM: CHEST 1  VIEW COMPARISON:  Chest radiographs 06/24/2003 FINDINGS: AP supine view at 1216 hours.Cardiac size at the upper limits of normal to mildly enlarged. Other mediastinal contours are within normal limits. Visualized tracheal air column is within normal limits. Allowing for technique the lungs are clear. No pneumothorax or effusion. IMPRESSION: No acute cardiopulmonary abnormality. Borderline to mild cardiomegaly. Electronically Signed   By: Odessa Fleming M.D.   On: 11/29/2015 12:50   Dg Shoulder Left  Result Date: 11/29/2015 CLINICAL DATA:  Acute left shoulder pain following fall. Initial encounter. EXAM: LEFT SHOULDER - 2+ VIEW COMPARISON:  None. FINDINGS: A mildly comminuted fracture of the lateral posterior humeral head/ greater tuberosity identified. There is no evidence of subluxation or dislocation. No suspicious focal bony lesions are noted. No other fractures identified. IMPRESSION: Mildly comminuted lateral posterior humeral head/ greater tuberosity fracture. Electronically Signed   By: Harmon Pier M.D.   On: 11/29/2015 12:52   Dg Hip Operative Unilat W Or W/o Pelvis Right  Result Date: 12/01/2015 CLINICAL DATA:  ORIF right hip EXAM: OPERATIVE RIGHT HIP (WITH PELVIS IF PERFORMED) 7 VIEWS TECHNIQUE: Fluoroscopic spot image(s) were submitted for interpretation post-operatively. COMPARISON:  11/29/2015. FINDINGS: ORIF right hip fracture. Hardware intact. Good anatomic alignment. Seven images obtained. 1 minutes 44 seconds fluoroscopy time . IMPRESSION: ORIF right hip fracture.  Hardware intact.  Anatomic alignment. Electronically Signed   By: Maisie Fus  Register   On: 12/01/2015 15:51   Dg Hip Unilat W Or Wo Pelvis 2-3 Views Right  Result Date: 11/29/2015 CLINICAL DATA:  Acute right hip pain following fall 2 days ago. Initial encounter. EXAM: DG HIP (WITH OR WITHOUT PELVIS) 2-3V RIGHT COMPARISON:  None. FINDINGS: A mildly comminuted intertrochanteric fracture the proximal right femur noted without  significant angulation identified. There is no evidence of subluxation or dislocation. Mild degenerative changes in the hips are present bilaterally. No suspicious focal bony lesions are noted. IMPRESSION: Mildly comminuted right intertrochanteric femur fracture. Electronically Signed   By: Harmon Pier M.D.   On: 11/29/2015 12:50    Micro Results     Recent Results (from the past 240 hour(s))  Surgical PCR screen     Status: None   Collection Time: 11/30/15  5:27 AM  Result Value Ref Range  Status   MRSA, PCR NEGATIVE NEGATIVE Final   Staphylococcus aureus NEGATIVE NEGATIVE Final    Comment:        The Xpert SA Assay (FDA approved for NASAL specimens in patients over 60 years of age), is one component of a comprehensive surveillance program.  Test performance has been validated by [facility] for patients greater than or equal to 60 year old. It is not intended to diagnose infection nor to guide or monitor treatment.     Today   Subjective    Chris Robinson today has no headache,no chest abdominal pain,no new weakness tingling or numbness, feels much better wants to go home today.     Objective   Blood pressure (!) 148/80, pulse 86, temperature 99.4 F (37.4 C), temperature source Oral, resp. rate 17, height 6' (1.829 m), weight 75.8 kg (167 lb), SpO2 99 %.   Intake/Output Summary (Last 24 hours) at 12/03/15 0912 Last data filed at 12/03/15 0909  Gross per 24 hour  Intake              240 ml  Output              950 ml  Net             -710 ml    Exam Awake Alert, Oriented x 3, No new F.N deficits, Normal affect [place].AT,PERRAL Supple Neck,No JVD, No cervical lymphadenopathy appriciated.  Symmetrical Chest wall movement, Good air movement bilaterally, CTAB RRR,No Gallops,Rubs or new Murmurs, No Parasternal Heave +ve B.Sounds, Abd Soft, Non tender, No organomegaly appriciated, No rebound -guarding or rigidity. No Cyanosis, Clubbing or edema, No new Rash or bruise,  Right hip postsurgical site appears clean no evidence of bleeding or hematoma.   Data Review   CBC w Diff: Lab Results  Component Value Date   WBC 10.2 12/02/2015   HGB 10.2 (L) 12/02/2015   HCT 30.0 (L) 12/02/2015   PLT 302 12/02/2015   LYMPHOPCT 8 11/29/2015   MONOPCT 13 11/29/2015   EOSPCT 0 11/29/2015   BASOPCT 0 11/29/2015    CMP: Lab Results  Component Value Date   NA 133 (L) 12/02/2015   K 4.2 12/02/2015   CL 102 12/02/2015   CO2 24 12/02/2015   BUN 10 12/02/2015   CREATININE 0.62 12/02/2015   PROT 7.7 11/29/2015   ALBUMIN 3.8 11/29/2015   BILITOT 2.0 (H) 11/29/2015   ALKPHOS 60 11/29/2015   AST 31 11/29/2015   ALT 27 11/29/2015  .   Total Time in preparing paper work, data evaluation and todays exam - 35 minutes  Leroy SeaSINGH,Janesa Dockery K M.D on 12/03/2015 at 9:12 AM  [facility]   Office  [phone]

## 2015-12-03 NOTE — Discharge Instructions (Signed)
Follow with Primary MD in 7 days   Get CBC, CMP, 2 view Chest X ray checked  by Primary MD or SNF MD in 5-7 days ( we routinely change or add medications that can affect your baseline labs and fluid status, therefore we recommend that you get the mentioned basic workup next visit with your PCP, your PCP may decide not to get them or add new tests based on their clinical decision)   Activity: weight bearing as tolerated with Full fall precautions use walker/wheelchair & assistance as needed   Disposition Home     Diet:   Heart Healthy   For Heart failure patients - Check your Weight same time everyday, if you gain over 2 pounds, or you develop in leg swelling, experience more shortness of breath or chest pain, call your Primary MD immediately. Follow Cardiac Low Salt Diet and 1.5 lit/day fluid restriction.   On your next visit with your primary care physician please Get Medicines reviewed and adjusted.   Please request your Prim.MD to go over all Hospital Tests and Procedure/Radiological results at the follow up, please get all Hospital records sent to your Prim MD by signing hospital release before you go home.   If you experience worsening of your admission symptoms, develop shortness of breath, life threatening emergency, suicidal or homicidal thoughts you must seek medical attention immediately by calling 911 or calling your MD immediately  if symptoms less severe.  You Must read complete instructions/literature along with all the possible adverse reactions/side effects for all the Medicines you take and that have been prescribed to you. Take any new Medicines after you have completely understood and accpet all the possible adverse reactions/side effects.   Do not drive, operate heavy machinery, perform activities at heights, swimming or participation in water activities or provide baby sitting services if your were admitted for syncope or siezures until you have seen by Primary MD or a  Neurologist and advised to do so again.  Do not drive when taking Pain medications.    Do not take more than prescribed Pain, Sleep and Anxiety Medications  Special Instructions: If you have smoked or chewed Tobacco  in the last 2 yrs please stop smoking, stop any regular Alcohol  and or any Recreational drug use.  Wear Seat belts while driving.   Please note  You were cared for by a hospitalist during your hospital stay. If you have any questions about your discharge medications or the care you received while you were in the hospital after you are discharged, you can call the unit and asked to speak with the hospitalist on call if the hospitalist that took care of you is not available. Once you are discharged, your primary care physician will handle any further medical issues. Please note that NO REFILLS for any discharge medications will be authorized once you are discharged, as it is imperative that you return to your primary care physician (or establish a relationship with a primary care physician if you do not have one) for your aftercare needs so that they can reassess your need for medications and monitor your lab values.

## 2015-12-03 NOTE — Progress Notes (Signed)
Patient ID: Chris Robinson, male   DOB: 1956/01/23, 60 y.o.   MRN: [id number] POD 2 IM NAIL RT INTERTROCH FRX  BP (!) 148/80 (BP Location: Left Arm)   Pulse 86   Temp 99.4 F (37.4 C) (Oral)   Resp 17   Ht 6' (1.829 m)   Wt 167 lb (75.8 kg)   SpO2 99%   BMI 22.65 kg/m   CBC Latest Ref Rng & Units 12/02/2015 12/01/2015 11/30/2015  WBC 4.0 - 10.5 K/uL 10.2 9.5 8.3  Hemoglobin 13.0 - 17.0 g/dL 10.2(L) 11.2(L) 11.1(L)  Hematocrit 39.0 - 52.0 % 30.0(L) 32.9(L) 32.1(L)  Platelets 150 - 400 K/uL 302 298 233    WBAT   FU IN 12 DAYS   OK TO DC   The postoperative plan is  Weightbearing status as tolerated  Staples out postop day 14  Doctor visits on postop day 14/15, 6 weeks and 12 weeks with x-rays at each visit  DVT prophylaxis aspirin for 28 days 325 mg daily

## 2015-12-03 NOTE — Progress Notes (Signed)
Pt discharged in stable condition into the care of his wife via wheelchair into a private vehicle.  PIV removed intact w/o S&S of complications.  Drsg changed to R hip. No S&S of infections noted. Discharge instructions reviewed with pt.  Pt verbalized understanding.  Prescription given to pt.

## 2015-12-06 ENCOUNTER — Encounter (HOSPITAL_COMMUNITY): Payer: Self-pay | Admitting: Orthopedic Surgery

## 2015-12-07 ENCOUNTER — Telehealth: Payer: Self-pay | Admitting: Orthopedic Surgery

## 2015-12-07 NOTE — Telephone Encounter (Signed)
Per Dr. Romeo AppleHarrison this patient is to come in on the 2nd for dressing change.  Appointment has been scheduled for Thursday, 12-15-15 at 1:30.  Dr. Romeo AppleHarrison also stated that he wanted an xray done prior to [facility] since we will not have an xray tech in the office that day.  Please verify this with Dr. Romeo AppleHarrison.  Thanks so much

## 2015-12-08 ENCOUNTER — Other Ambulatory Visit: Payer: Self-pay | Admitting: *Deleted

## 2015-12-08 ENCOUNTER — Ambulatory Visit: Payer: Self-pay

## 2015-12-08 DIAGNOSIS — S72144D Nondisplaced intertrochanteric fracture of right femur, subsequent encounter for closed fracture with routine healing: Secondary | ICD-10-CM

## 2015-12-08 NOTE — Telephone Encounter (Signed)
We will also not have Dr Romeo AppleHarrison in the office that day?

## 2015-12-08 NOTE — Telephone Encounter (Signed)
YES PLEASE ADVISE TO GO TO HOSPITAL PRIOR TO APPT FOR XRAY, ORDER ENTERED

## 2015-12-08 NOTE — Telephone Encounter (Signed)
YES

## 2015-12-15 ENCOUNTER — Other Ambulatory Visit: Payer: Self-pay | Admitting: Orthopedic Surgery

## 2015-12-15 ENCOUNTER — Ambulatory Visit (HOSPITAL_COMMUNITY): Admission: RE | Admit: 2015-12-15 | Payer: Self-pay | Source: Ambulatory Visit

## 2015-12-15 ENCOUNTER — Encounter (HOSPITAL_COMMUNITY): Payer: Self-pay

## 2015-12-15 ENCOUNTER — Ambulatory Visit: Payer: Self-pay | Admitting: Orthopedic Surgery

## 2015-12-15 ENCOUNTER — Ambulatory Visit (HOSPITAL_COMMUNITY)
Admission: RE | Admit: 2015-12-15 | Discharge: 2015-12-15 | Disposition: A | Discharge status: Home / Self Care | Payer: Self-pay | Source: Ambulatory Visit | Attending: Orthopedic Surgery | Admitting: Orthopedic Surgery

## 2015-12-15 DIAGNOSIS — S72144D Nondisplaced intertrochanteric fracture of right femur, subsequent encounter for closed fracture with routine healing: Secondary | ICD-10-CM

## 2015-12-29 ENCOUNTER — Telehealth: Payer: Self-pay | Admitting: Orthopedic Surgery

## 2015-12-29 NOTE — Telephone Encounter (Signed)
Debbie from [facility] called asking if we could do a order for the patient   1 time a week for 2 weeks for PT.  They would like to also send a Child psychotherapistsocial worker to the home.  Please call and advise.

## 2016-01-03 NOTE — Telephone Encounter (Signed)
YES

## 2016-01-03 NOTE — Telephone Encounter (Signed)
Returned call, no answer, left detailed vm with order approval

## 2016-01-04 ENCOUNTER — Telehealth: Payer: Self-pay | Admitting: Orthopedic Surgery

## 2016-01-04 NOTE — Telephone Encounter (Signed)
Dyanne IhaDebbie Dabbs from [facility] called and just wanted you to know that she saw Mr. Chris Robinson today.  She states that Mr. Chris Robinson has not gotten out of his recliner in 5 days, he reeks of alcohol and has 3 to 4+ edema in his feet.  She further states that Mr. Chris Robinson has not had a shower since his surgery and he pretty much refuses to do anything that is asked of him.  She has explained to him repeatedly about getting his feet elevated so that the swelling will go down.  She did say that the incision looks good but the feet are really swollen.  She said he does not have a PCP and he refuses to go to the hospital.

## 2016-01-09 NOTE — Telephone Encounter (Signed)
Ok thank you 

## 2016-01-10 ENCOUNTER — Telehealth: Payer: Self-pay | Admitting: Orthopedic Surgery

## 2016-01-10 ENCOUNTER — Ambulatory Visit: Payer: Self-pay | Admitting: Orthopedic Surgery

## 2016-01-10 NOTE — Telephone Encounter (Signed)
Patient called and said he needed to reschedule his appointment for today.  I told him that it was really important that he come in to see Dr. Romeo AppleHarrison today.  He said he had a family emergency.  He stated that he has to come for his appointments in the afternoon.  Dr. Romeo AppleHarrison wont be back in the office in the afternoon until Tuesday 01-17-16.  Appointment was given to Chris Robinson for that day.  I again told him that it was very important for him to keep this appointment.

## 2016-01-12 ENCOUNTER — Encounter (HOSPITAL_COMMUNITY): Payer: Self-pay | Admitting: Orthopedic Surgery

## 2016-01-17 ENCOUNTER — Ambulatory Visit (INDEPENDENT_AMBULATORY_CARE_PROVIDER_SITE_OTHER): Payer: Self-pay | Admitting: Orthopedic Surgery

## 2016-01-17 ENCOUNTER — Encounter: Payer: Self-pay | Admitting: Orthopedic Surgery

## 2016-01-17 ENCOUNTER — Ambulatory Visit (INDEPENDENT_AMBULATORY_CARE_PROVIDER_SITE_OTHER): Payer: Self-pay

## 2016-01-17 DIAGNOSIS — S72144D Nondisplaced intertrochanteric fracture of right femur, subsequent encounter for closed fracture with routine healing: Secondary | ICD-10-CM

## 2016-01-17 MED ORDER — HYDROCODONE-ACETAMINOPHEN 5-325 MG PO TABS: 1.0000 | ORAL_TABLET | Freq: Four times a day (QID) | ORAL | 0 refills | Status: DC | PRN

## 2016-01-17 NOTE — Progress Notes (Signed)
Patient ID: Chris Robinson, male   DOB: [birth year], 60 y.o.   MRN: [id number]  Post op visit   Chief Complaint  Patient presents with  . Follow-up    Rt hip fracture, DOS 12/01/15    Postop day #47 week #6  Right hip gamma nail fixation  Patient's feet are swollen no signs of DVT  Patient ambulatory with a walker  Both incisions distally looked normal in the proximal incision healed well and also  X-ray show fracture stable no hardware complications  Patient advised to continue physical therapy come back in 6 weeks for repeat x-ray and I refilled his pain medicine.[facility] Meds ordered this encounter  Medications  . aspirin 325 MG tablet    Sig: Take 325 mg by mouth daily.  . Acetaminophen (TYLENOL PO)    Sig: Take by mouth.  Marland Kitchen. HYDROcodone-acetaminophen (NORCO/VICODIN) 5-325 MG tablet    Sig: Take 1 tablet by mouth every 6 (six) hours as needed for moderate pain.    Dispense:  28 tablet    Refill:  0

## 2016-02-27 ENCOUNTER — Ambulatory Visit: Payer: Self-pay | Admitting: Orthopedic Surgery

## 2016-03-06 ENCOUNTER — Ambulatory Visit: Payer: Self-pay

## 2016-03-06 ENCOUNTER — Encounter: Payer: Self-pay | Admitting: Orthopedic Surgery

## 2016-03-13 ENCOUNTER — Ambulatory Visit: Payer: Self-pay

## 2016-03-13 ENCOUNTER — Encounter: Payer: Self-pay | Admitting: Orthopedic Surgery

## 2016-03-13 NOTE — Progress Notes (Signed)
No viist

## 2016-03-20 ENCOUNTER — Ambulatory Visit (INDEPENDENT_AMBULATORY_CARE_PROVIDER_SITE_OTHER): Payer: Self-pay

## 2016-03-20 ENCOUNTER — Ambulatory Visit (INDEPENDENT_AMBULATORY_CARE_PROVIDER_SITE_OTHER): Payer: Self-pay | Admitting: Orthopedic Surgery

## 2016-03-20 ENCOUNTER — Encounter: Payer: Self-pay | Admitting: Orthopedic Surgery

## 2016-03-20 DIAGNOSIS — S72144D Nondisplaced intertrochanteric fracture of right femur, subsequent encounter for closed fracture with routine healing: Secondary | ICD-10-CM

## 2016-03-20 NOTE — Progress Notes (Signed)
Patient ID: Chris DeisCraig S Robinson, male   DOB: [birth year], 61 y.o.   MRN: [id number]  POST OP FOLLOW UP     Chief Complaint  Patient presents with  . Follow-up    RIGHT HIP FRACTURE, DOS 12/01/15   The patient a right hip fracture stabilized with a gamma nail back in October  Report back because he was still having some pain in the hip. He complains of mild hip pain at this time is walking with a walker. He is uninsured is doing his own physical therapy  He does ambulate with a walker and has no significant limping. He has excellent flexion and extension of his hip is stable he has good strength on hip flexion tenderness to palpation skin is normal distally has peripheral edema from chronic venous stasis disease  No wounds are noted in the foot.  He will follow-up on an as-needed basis  [facility] for ongoing medical needs.  X-ray shows stable fixation right hip gamma nail with appropriate healing approximately 15 weeks from surgery   Encounter Diagnosis  Name Primary?  . Closed nondisplaced intertrochanteric fracture of right femur with routine healing, subsequent encounter Yes      3:34 PM Fuller CanadaStanley Harrison, MD 03/20/2016

## 2017-02-12 DEATH — deceased

## 2017-07-23 IMAGING — DX DG HIP (WITH OR WITHOUT PELVIS) 2-3V*R*
3 series · 3 of 3 positions shown · non-contrast
Comparison: 12/01/2015

CLINICAL DATA: Followup ORIF of right hip fracture

EXAM:
DG HIP (WITH OR WITHOUT PELVIS) 2-3V RIGHT

[pelvis ap]
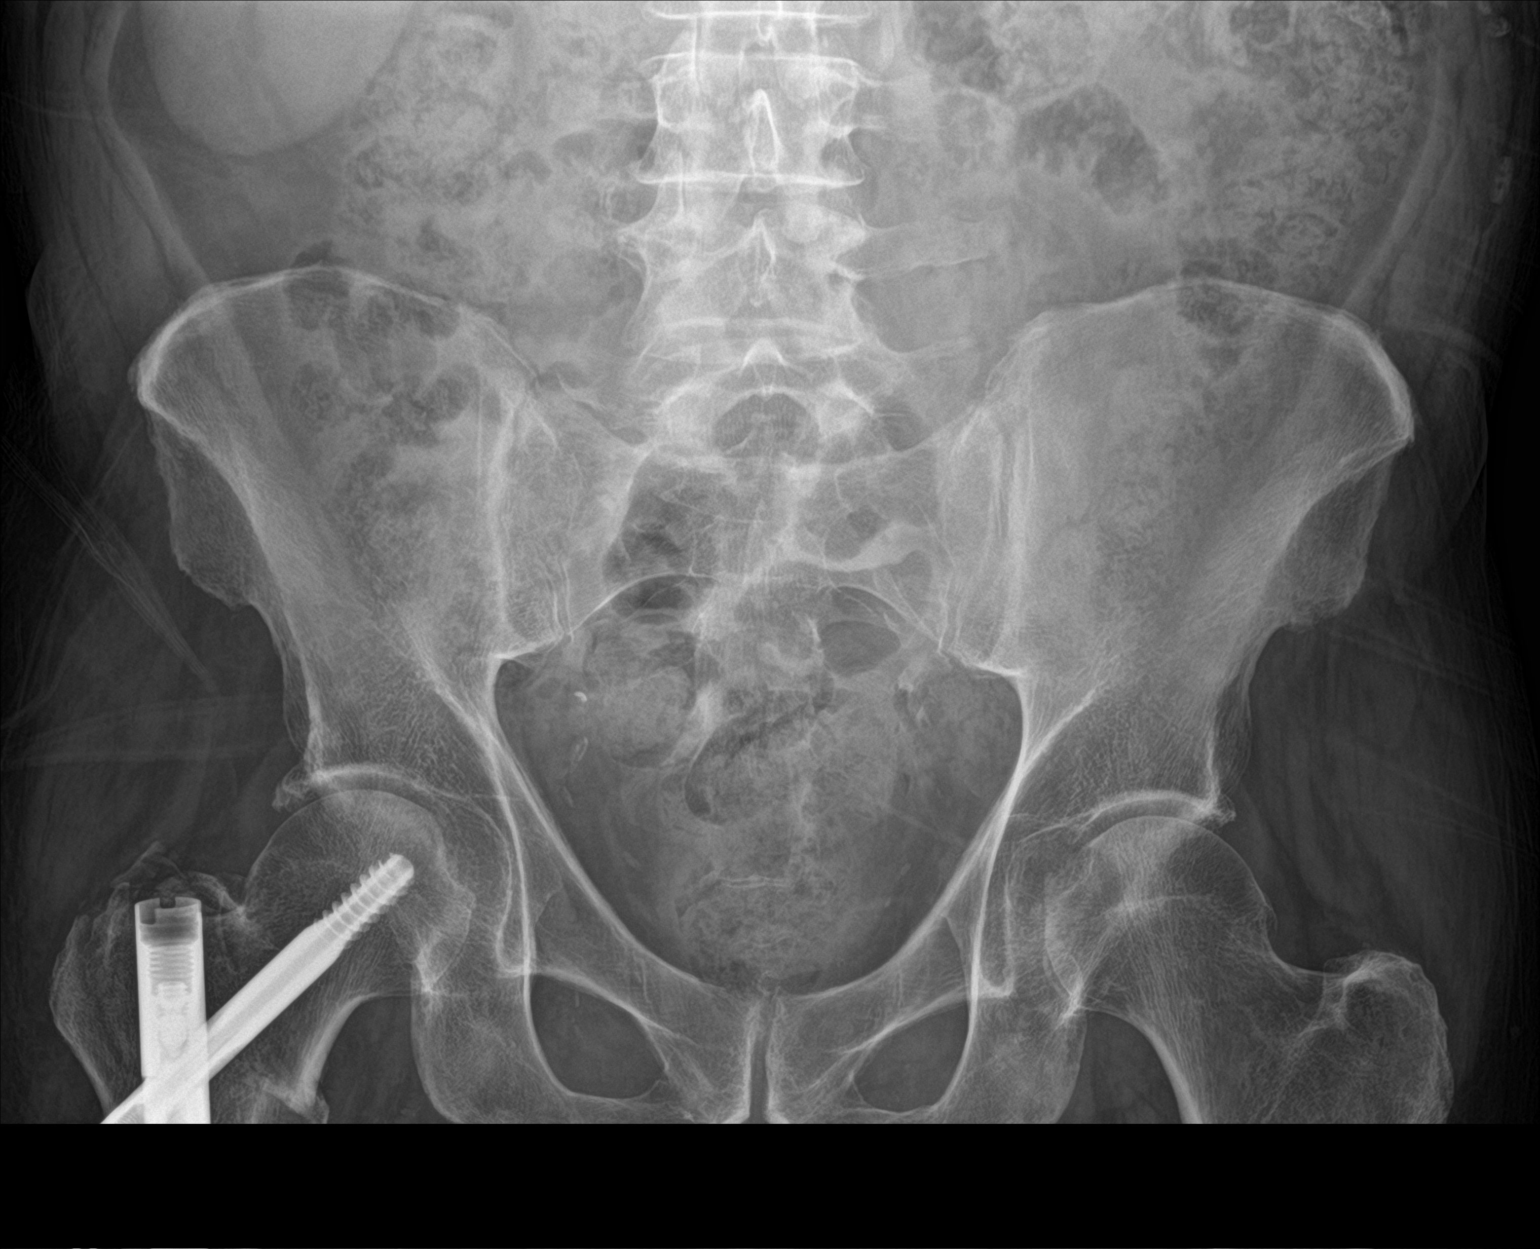

[hip ap]
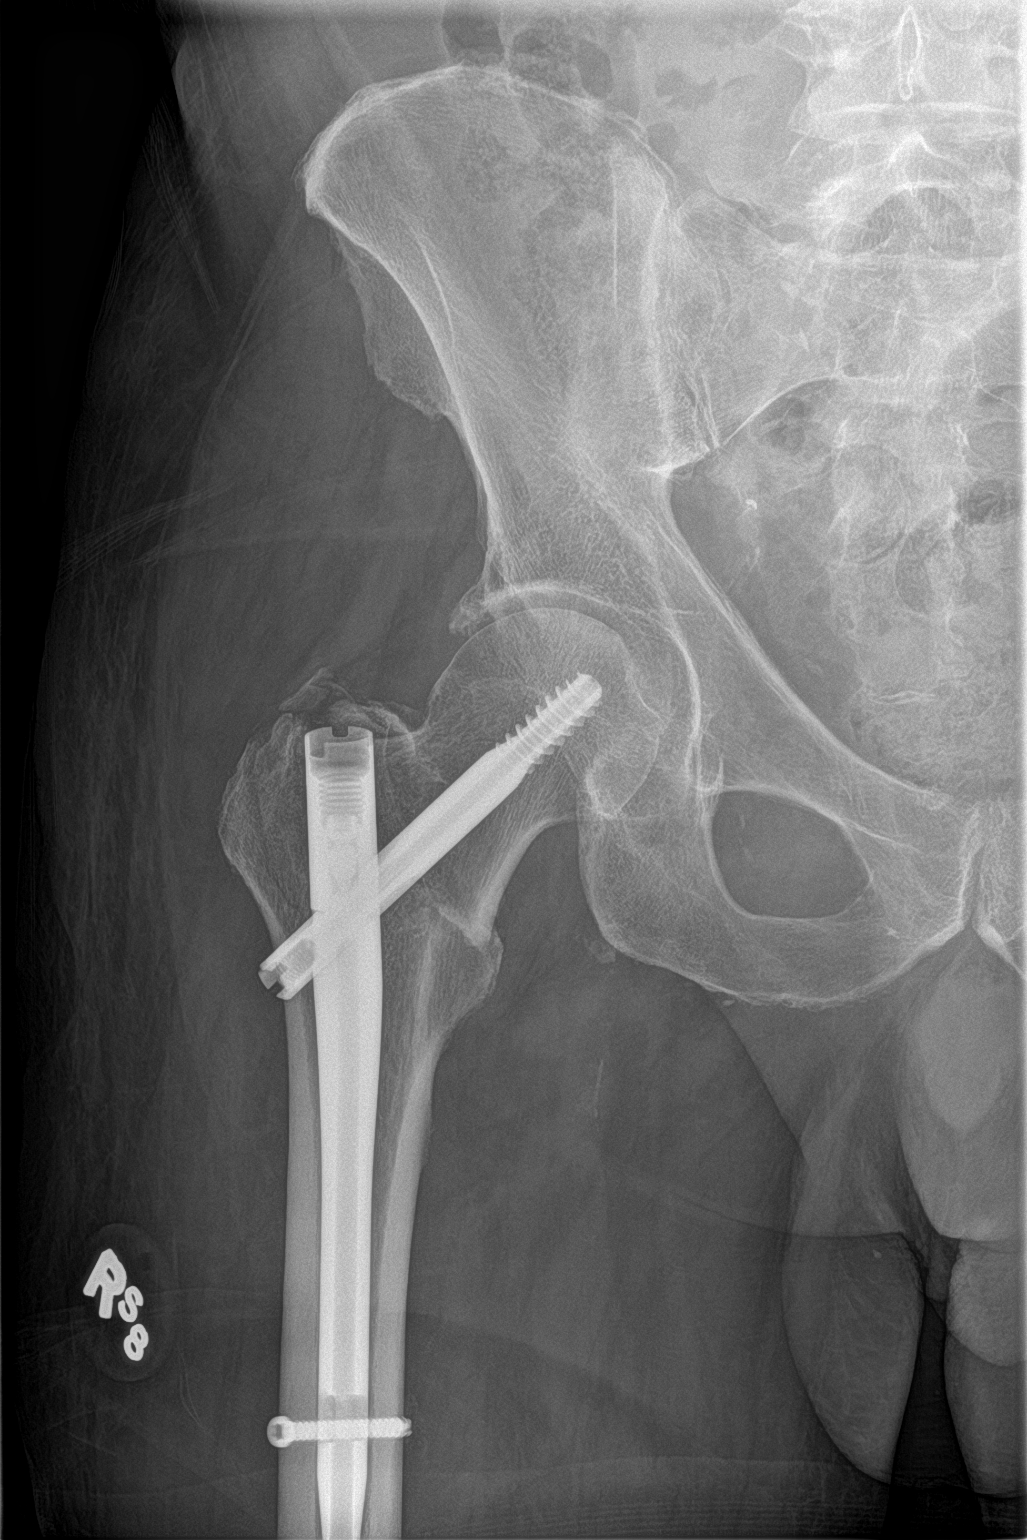

[hip lat]
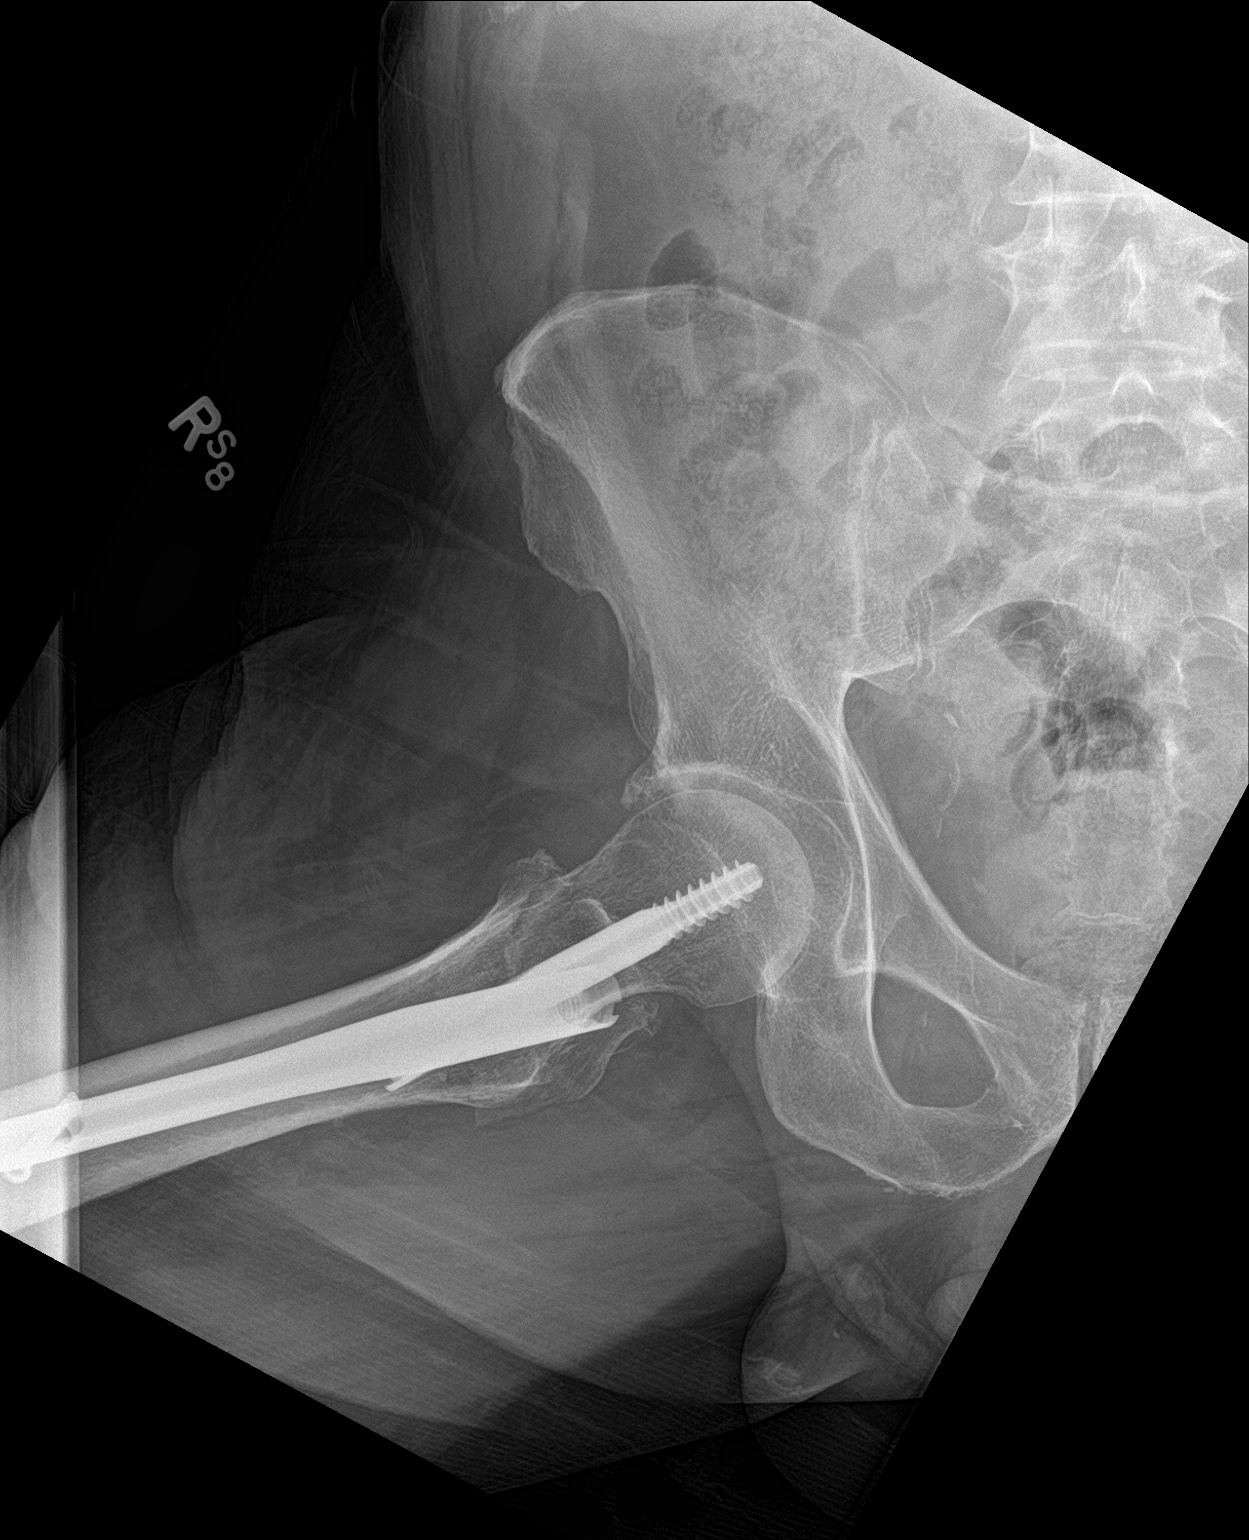

[3 of 3 positions shown; findings below may reference images not displayed]

FINDINGS: Medullary rod is again noted with compression screw extending into
the femoral neck on the right. Fracture fragments are in near
anatomic alignment. No new fracture is seen. No acute soft tissue
abnormality is noted.
IMPRESSION: Status post ORIF of proximal right femoral fracture. No acute
abnormality is seen.

## 2021-11-09 NOTE — Progress Notes (Signed)
ERROR
